# Patient Record
Sex: Male | Born: 1970 | Hispanic: Yes | Marital: Single | Smoking: Never smoker
Health system: Southern US, Community
[De-identification: ages and names within clinical notes are randomized; demographics above are authoritative.]

## PROBLEM LIST (undated history)

## (undated) DIAGNOSIS — E119 Type 2 diabetes mellitus without complications: Secondary | ICD-10-CM

---

## 2012-05-31 ENCOUNTER — Encounter (HOSPITAL_COMMUNITY): Payer: Self-pay | Admitting: Emergency Medicine

## 2012-05-31 ENCOUNTER — Emergency Department (HOSPITAL_COMMUNITY): Payer: Self-pay

## 2012-05-31 ENCOUNTER — Emergency Department (HOSPITAL_COMMUNITY)
Admission: EM | Admit: 2012-05-31 | Discharge: 2012-05-31 | Disposition: A | Discharge status: Home / Self Care | Payer: Self-pay | Attending: Emergency Medicine | Admitting: Emergency Medicine

## 2012-05-31 DIAGNOSIS — N390 Urinary tract infection, site not specified: Secondary | ICD-10-CM | POA: Insufficient documentation

## 2012-05-31 DIAGNOSIS — R7309 Other abnormal glucose: Secondary | ICD-10-CM | POA: Insufficient documentation

## 2012-05-31 DIAGNOSIS — K7689 Other specified diseases of liver: Secondary | ICD-10-CM | POA: Insufficient documentation

## 2012-05-31 DIAGNOSIS — E279 Disorder of adrenal gland, unspecified: Secondary | ICD-10-CM | POA: Insufficient documentation

## 2012-05-31 DIAGNOSIS — R339 Retention of urine, unspecified: Secondary | ICD-10-CM | POA: Insufficient documentation

## 2012-05-31 DIAGNOSIS — E278 Other specified disorders of adrenal gland: Secondary | ICD-10-CM

## 2012-05-31 DIAGNOSIS — R739 Hyperglycemia, unspecified: Secondary | ICD-10-CM

## 2012-05-31 LAB — URINALYSIS, ROUTINE W REFLEX MICROSCOPIC
Bilirubin Urine: NEGATIVE
Glucose, UA: >1000 mg/dL — AB
Specific Gravity, Urine: 1.02 (ref 1.005–1.030)
pH: 6 (ref 5.0–8.0)

## 2012-05-31 LAB — DIFFERENTIAL
Eosinophils Absolute: 0 10*3/uL (ref 0.0–0.7)
Eosinophils Relative: 0 % (ref 0–5)
Lymphs Abs: 0.9 10*3/uL (ref 0.7–4.0)
Monocytes Relative: 4 % (ref 3–12)
Neutrophils Relative %: 90 % — ABNORMAL HIGH (ref 43–77)

## 2012-05-31 LAB — BASIC METABOLIC PANEL
BUN: 10 mg/dL (ref 6–23)
CO2: 24 mEq/L (ref 19–32)
GFR calc non Af Amer: >90 mL/min (ref >90)
Glucose, Bld: 317 mg/dL — ABNORMAL HIGH (ref 70–99)
Potassium: 3.2 mEq/L — ABNORMAL LOW (ref 3.5–5.1)

## 2012-05-31 LAB — CBC
Hemoglobin: 13.3 g/dL (ref 13.0–17.0)
MCH: 27.3 pg (ref 26.0–34.0)
MCV: 78.1 fL (ref 78.0–100.0)
RBC: 4.88 MIL/uL (ref 4.22–5.81)

## 2012-05-31 LAB — URINE MICROSCOPIC-ADD ON

## 2012-05-31 MED ORDER — METFORMIN HCL 500 MG PO TABS: 500.0000 mg | ORAL_TABLET | Freq: Two times a day (BID) | ORAL | Status: DC

## 2012-05-31 MED ORDER — ONDANSETRON HCL 4 MG/2ML IJ SOLN
4.0000 mg | Freq: Once | INTRAMUSCULAR | Status: DC
  Filled 2012-05-31: qty 2

## 2012-05-31 MED ORDER — HYDROMORPHONE HCL PF 1 MG/ML IJ SOLN
1.0000 mg | Freq: Once | INTRAMUSCULAR | Status: DC
  Filled 2012-05-31: qty 1

## 2012-05-31 MED ORDER — KETOROLAC TROMETHAMINE 30 MG/ML IJ SOLN
30.0000 mg | Freq: Once | INTRAMUSCULAR | Status: DC
  Filled 2012-05-31: qty 1

## 2012-05-31 MED ORDER — CEPHALEXIN 500 MG PO CAPS: 500.0000 mg | ORAL_CAPSULE | Freq: Four times a day (QID) | ORAL | Status: AC

## 2012-05-31 MED ORDER — CEFTRIAXONE SODIUM 1 G IJ SOLR
1.0000 g | Freq: Once | INTRAMUSCULAR | Status: AC
  Administered 2012-05-31: 1 g via INTRAVENOUS
  Filled 2012-05-31: qty 10

## 2012-05-31 MED ORDER — SODIUM CHLORIDE 0.9 % IV SOLN: Freq: Once | INTRAVENOUS | Status: DC

## 2012-05-31 NOTE — ED Notes (Signed)
Patient denies pain and is resting comfortably.  States he does not want any pain medication at this time.  Initial output 300 cc with additional 1100 over 10 minutes.  Patient states he feels much better.

## 2012-05-31 NOTE — ED Notes (Addendum)
Patient complaining of groin pain and unable to void. States he last voided last night.

## 2012-05-31 NOTE — ED Provider Notes (Signed)
History     CSN: [id number]  Arrival date & time 05/31/12  0865   First MD Initiated Contact with Patient 05/31/12 [phone]      Chief Complaint  Patient presents with  . Urinary Retention    (Consider location/radiation/quality/duration/timing/severity/associated sxs/prior treatment) The history is provided by the patient.   41 year old male who has not been able to urinate since 9 PM. He is complaining of pain in the suprapubic area. Pain is severe and he rates it at 9/10. There is no associated nausea, vomiting, fever, chills, sweats. He's taking ibuprofen but no other medications.  History reviewed. No pertinent past medical history.  History reviewed. No pertinent past surgical history.  No family history on file.  History  Substance Use Topics  . Smoking status: Never Smoker   . Smokeless tobacco: Not on file  . Alcohol Use: No      Review of Systems  All other systems reviewed and are negative.    Allergies  Review of patient's allergies indicates no known allergies.  Home Medications   Current Outpatient Rx  Name Route Sig Dispense Refill  . IBUPROFEN 100 MG PO TABS Oral Take 100 mg by mouth every 6 (six) hours as needed.      BP 149/92  Pulse 87  Temp 99.2 F (37.3 C) (Oral)  Resp 20  Ht 5\' 10"  (1.778 m)  Wt 160 lb (72.576 kg)  BMI 22.96 kg/m2  SpO2 97%  Physical Exam  Nursing note and vitals reviewed.  41 year old male who appears uncomfortable. Vital signs are significant for hypertension with blood pressure 149/92. Oxygen saturation is 97% which is normal. Head is normocephalic and atraumatic. PERRLA, EOMI. Neck is nontender and supple. Back is nontender. Lungs are clear without rales, wheezes, or rhonchi. Heart has regular rate rhythm without murmur. Abdomen is soft, flat, with distended bladder which is tender. There is no CVA tenderness. Extremities have no cyanosis or edema, full range of motion is present. Skin is warm and dry without rash.  Neurologic: Mental status is normal, cranial nerves are intact, there are no motor or sensory deficits.  ED Course  Procedures (including critical care time)  Results for orders placed during the hospital encounter of 05/31/12  URINALYSIS, ROUTINE W REFLEX MICROSCOPIC      Component Value Range   Color, Urine YELLOW  YELLOW   APPearance CLEAR  CLEAR   Specific Gravity, Urine 1.020  1.005 - 1.030   pH 6.0  5.0 - 8.0   Glucose, UA >1000 (*) NEGATIVE mg/dL   Hgb urine dipstick MODERATE (*) NEGATIVE   Bilirubin Urine NEGATIVE  NEGATIVE   Ketones, ur NEGATIVE  NEGATIVE mg/dL   Protein, ur 30 (*) NEGATIVE mg/dL   Urobilinogen, UA 0.2  0.0 - 1.0 mg/dL   Nitrite NEGATIVE  NEGATIVE   Leukocytes, UA NEGATIVE  NEGATIVE  CBC      Component Value Range   WBC 15.1 (*) 4.0 - 10.5 K/uL   RBC 4.88  4.22 - 5.81 MIL/uL   Hemoglobin 13.3  13.0 - 17.0 g/dL   HCT 96.2 (*) 95.2 - 84.1 %   MCV 78.1  78.0 - 100.0 fL   MCH 27.3  26.0 - 34.0 pg   MCHC 34.9  30.0 - 36.0 g/dL   RDW 32.4  40.1 - 02.7 %   Platelets 356  150 - 400 K/uL  DIFFERENTIAL      Component Value Range   Neutrophils Relative 90 (*) 43 - 77 %  Neutro Abs 13.5 (*) 1.7 - 7.7 K/uL   Lymphocytes Relative 6 (*) 12 - 46 %   Lymphs Abs 0.9  0.7 - 4.0 K/uL   Monocytes Relative 4  3 - 12 %   Monocytes Absolute 0.5  0.1 - 1.0 K/uL   Eosinophils Relative 0  0 - 5 %   Eosinophils Absolute 0.0  0.0 - 0.7 K/uL   Basophils Relative 0  0 - 1 %   Basophils Absolute 0.0  0.0 - 0.1 K/uL  BASIC METABOLIC PANEL      Component Value Range   Sodium 133 (*) 135 - 145 mEq/L   Potassium 3.2 (*) 3.5 - 5.1 mEq/L   Chloride 96  96 - 112 mEq/L   CO2 24  19 - 32 mEq/L   Glucose, Bld 317 (*) 70 - 99 mg/dL   BUN 10  6 - 23 mg/dL   Creatinine, Ser 0.10 (*) 0.50 - 1.35 mg/dL   Calcium 9.0  8.4 - 27.2 mg/dL   GFR calc non Af Amer >90  >90 mL/min   GFR calc Af Amer >90  >90 mL/min  URINE MICROSCOPIC-ADD ON      Component Value Range   WBC, UA 7-10  <3  WBC/hpf   RBC / HPF 7-10  <3 RBC/hpf   Bacteria, UA MANY (*) RARE   Ct Abdomen Pelvis Wo Contrast  05/31/2012  *RADIOLOGY REPORT*  Clinical Data: Urinary retention.  Testicular pain.  CT ABDOMEN AND PELVIS WITHOUT CONTRAST  Technique:  Multidetector CT imaging of the abdomen and pelvis was performed following the standard protocol without intravenous contrast.  Comparison: None.  Findings: The visualized lung bases are clear.  Heart size is normal.  No significant pleural or pericardial effusion is present.  Diffuse fatty infiltration is present in the liver.  No focal hepatic lesions are evident.  The spleen is within normal limits. The stomach, duodenum, pancreas are within normal limits.  Common bile duct and gallbladder are unremarkable.  A 21 x 15 mm lesion of the right adrenal gland demonstrates measures approximately 30 HU. The left adrenal gland is normal.  The kidneys and ureters are within normal limits bilaterally. The urinary bladder is collapsed with a Foley catheter in place.  The rectosigmoid colon is within normal limits.  The remainder of the colon is unremarkable.  The appendix is visualized and within normal limits.  No significant adenopathy or free fluid is present. Mild degenerative changes are noted at the SI joints bilaterally. The bone windows are otherwise unremarkable.  IMPRESSION:  1.  21 mm right adrenal lesion is not clearly characterized. The lesion otherwise has benign characteristics.  Consider follow-up CT or more definitively noncontrast MRI in 12 months. 2.  Diffuse fatty infiltration of the liver. 3.  The urinary bladder is collapsed about a Foley catheter. 4.  Mild degenerative changes at the SI joints.  Original Report Authenticated By: Jamesetta Orleans. MATTERN, M.D.      1. Hyperglycemia   2. Urinary retention   3. UTI (lower urinary tract infection)   4. Adrenal mass       MDM  Acute urinary retention. Foley catheter will be placed and urine sent for  urinalysis.  He did not get much relief with placing Foley catheter, and only 300 mL of urine was obtained. It is possible that his symptoms are from ureterolithiasis a CT scan will be obtained and he is given hydromorphone, ondansetron, and ketorolac for pain and nausea.  Dione Booze, MD 06/01/12 [phone]

## 2012-05-31 NOTE — ED Notes (Signed)
Leg bag placed on pt for home use with instructions to have foley removed in one week.

## 2012-05-31 NOTE — ED Provider Notes (Signed)
Pt assumed at signout He has over urine output soon after foley placement He is now improved Abdomen soft, denies back pain, denies abdominal pain, no focal weakness He denies h/o DM I used phone interpreter to inform him of diabetes and will start metformin (denies liver disease or etoh use) Also advised of uti, and will need catheter for one week for urinary retention Also advised of adrenal mass, advised this could be cancer and needs repeat imaging in one year BP 149/92  Pulse 87  Temp 99.2 F (37.3 C) (Oral)  Resp 20  Ht 5\' 10"  (1.778 m)  Wt 160 lb (72.576 kg)  BMI 22.96 kg/m2  SpO2 97% Pt feels well for d/c home and understands instructions Abdomen soft He is circumcised, no testicular tenderness/edema (chaperone present).  No focal motor deficits  Joya Gaskins, MD 05/31/12 [phone]

## 2012-05-31 NOTE — Discharge Instructions (Signed)
Hiperglucemia (Hyperglycemia) La hiperglucemia ocurre cuando la glucosa (azcar) en su sangre est demasiado elevada. Puede suceder por varias razones, pero a menudo ocurre en personas que no saben que tienen diabetes o no la controlan adecuadamente.  CAUSAS Tanto si tiene diabetes como si no, existen otras causas para la hiperglucemia. La hiperglucemia puede producirse cuando tiene diabetes, pero tambin puede presentarse en otras situaciones de las que podra no ser consciente, como por ejemplo: Diabetes  Si tiene diabetes y tiene problemas para controlar su glucosa en sangre, la hiperglucemia podra producirse debido a las siguientes razones:   No seguir Press photographer.   No tomar los medicamentos para la diabetes o tomarlos de forma inadecuada.   Realizar menos ejercicio del que normalmente hace.   Estar enfermo.  Prediabetes  Esto no puede ignorarse. Antes de que la persona presente diabetes de tipo 2, casi siempre hay "prediabetes". Esto ocurre cuando su glucosa en sangre es mayor que lo normal, pero no lo suficiente como para diagnosticar diabetes. La investigacin ha demostrado que algunos daos al cuerpo de Air cabin crew, en especial los del corazn y el sistema circulatorio, podran haber ocurrido durante el periodo de prediabetes. Si controla la glucosa en sangre cuando tiene prediabetes, podr retardar o evitar que se desarrrolle la diabetes tipo 2.  El estrs  Si tiene diabetes, deber hacer una dieta, tomar medicamentos orales o insulina para [place]. Sin embargo, Clinical research associate que la glucosa en sangre es mayor que lo normal en el hospital tenga o no diabetes. Cientficamente se lo denomina "hiperglucemia por estrs". El estrs puede elevar su glucosa en sangre. Esto ocurre porque el organismo genera hormonas en los momentos de estrs. Si el estrs ha [employer] causa del alto nivel de glucosa en [place], [place] mdico podr Education officer, environmental un seguimiento de [place] regular. Feliberto Harts, podr asegurarse de que la hiperglucemia no empeora o progresa hacia diabetes.  Esteroides  Los esteroides son medicamentos que actan en la infeccin que ataca al sistema inmunolgico para bloquear la inflamacin o la infeccin. Un efecto secundario puede ser el aumento de glucosa en [place]. Muchas personas pueden producir la suficiente insulina extra para este aumento, pero aquellos que no pueden, los esteroides pueden [employer] niveles sean an [place]. No es inusual que los tratamientos con esteorides "destapen" una diabetes que se est desarrollando. No siempre es posible determinar si la hiperglucemia desaparecer una vez que se detenga el consumo de esteroides. A veces se realiza un anlisis de sangre especial denominado A1c para determinar si la glucosa en sangre se ha elevado antes de comenzar con el consumo de esteroides.  SNTOMAS  Sed.   Necesidad frecuente de Geographical information systems officer.   [employer].   Visin borrosa.   Cansancio o fatiga.   Debilidad.   Somnolencia.   Hormigueo en el pie o pierna.  DIAGNSTICO El diagnstico se realiza mediante el control de la glucosa en sangre de una o varias de las siguientes maneras:  Anlisis A1c. Es una sustancia qumica que se encuentra en la [place].   Control de glucosa en sangre con tiras de prueba.   Resultados de laboratorio.  TRATAMIENTO Primero, es importante conocer la causa de la hiperglucemia antes de tratarla. El tratamiento puede ser el siguiente, [place] pueden ser otros:  Educacin   Cambios o ajustes en los medicamentos.   Cambios o ajustes en el plan de alimentacin.   Tratamiento por enfermedades, infecciones, etc.   Control de glucosa en sangre ms frecuente.  Cambios en el plan de ejercicios.   Disminucin o interrupcin del consumo de esteroides.   Cambios en el estilo de vida.  INSTRUCCIONES PARA EL CUIDADO DOMICILIARIO  Contrlese la glucosa en sangre, como se lo indicaron.   Haga ejercicios  regularmente. El profesional que lo asiste le dar instrucciones relacionadas con el ejercicio fsico. La prediabetes que es consecuencia de situaciones de estrs, puede mejorar con la actividad fsica.   Consuma alimentos saludables y balanceados. Coma a menudo y de [place] regular, en momentos fijos. El profesional o el nutricionista le dar una dieta especial para controlar su ingestin de azcar.   Mantener su peso ideal es importante. Si lo necesita, perder un poco de peso, como 5  7 Kg. puede ser beneficioso para [employer] niveles de Production assistant, radio.  SOLICITE ATENCIN MDICA SI:  Tiene preguntas relacionadas con los medicamentos, la actividad o la dieta.   Contina teniendo sntomas (como mucha sed, deseos intensos de Geographical information systems officer o aumento de peso)  SOLICITE ATENCIN MDICA DE INMEDIATO SI:  Vomita o tiene diarrea.   Su respiracin huele frutal.   La frecuencia respiratoria es ms rpida o ms lenta.   Est somnoliento o incoherente.   Siente adormecimiento, hormigueos o Tax adviser o en las manos.   Siente dolor en el pecho.   Sus sntomas empeoran aunque haya seguido las indicaciones de su mdico.   Tiene otras preguntas o preocupaciones.  Document Released: 11/29/2005 Document Revised: 11/18/2011 [facility] Patient Information 2012 [place], [place].Hiperglucemia (Hyperglycemia) La hiperglucemia ocurre cuando la glucosa (azcar) en su sangre est demasiado elevada. Puede suceder por varias razones, pero a menudo ocurre en personas que no saben que tienen diabetes o no la controlan adecuadamente.  CAUSAS Tanto si tiene diabetes como si no, existen otras causas para la hiperglucemia. La hiperglucemia puede producirse cuando tiene diabetes, pero tambin puede presentarse en otras situaciones de las que podra no ser consciente, como por ejemplo: Diabetes  Si tiene diabetes y tiene problemas para controlar su glucosa en sangre, la hiperglucemia podra producirse debido a las  siguientes razones:   No seguir Press photographer.   No tomar los medicamentos para la diabetes o tomarlos de forma inadecuada.   Realizar menos ejercicio del que normalmente hace.   Estar enfermo.  Prediabetes  Esto no puede ignorarse. Antes de que la persona presente diabetes de tipo 2, casi siempre hay "prediabetes". Esto ocurre cuando su glucosa en sangre es mayor que lo normal, pero no lo suficiente como para diagnosticar diabetes. La investigacin ha demostrado que algunos daos al cuerpo de Air cabin crew, en especial los del corazn y el sistema circulatorio, podran haber ocurrido durante el periodo de prediabetes. Si controla la glucosa en sangre cuando tiene prediabetes, podr retardar o evitar que se desarrrolle la diabetes tipo 2.  El estrs  Si tiene diabetes, deber hacer una dieta, tomar medicamentos orales o insulina para [place]. Sin embargo, Clinical research associate que la glucosa en sangre es mayor que lo normal en el hospital tenga o no diabetes. Cientficamente se lo denomina "hiperglucemia por estrs". El estrs puede elevar su glucosa en sangre. Esto ocurre porque el organismo genera hormonas en los momentos de estrs. Si el estrs ha [employer] causa del alto nivel de glucosa en [place], [place] mdico podr Education officer, environmental un seguimiento de [place] regular. Feliberto Harts, podr asegurarse de que la hiperglucemia no empeora o progresa hacia diabetes.  Esteroides  Los esteroides son medicamentos que actan en la infeccin que  ataca al sistema inmunolgico para bloquear la inflamacin o la infeccin. Un efecto secundario puede ser el aumento de glucosa en [place]. Muchas personas pueden producir la suficiente insulina extra para este aumento, pero aquellos que no pueden, los esteroides pueden [employer] niveles sean an [place]. No es inusual que los tratamientos con esteorides "destapen" una diabetes que se est desarrollando. No siempre es posible determinar si la hiperglucemia desaparecer una  vez que se detenga el consumo de esteroides. A veces se realiza un anlisis de sangre especial denominado A1c para determinar si la glucosa en sangre se ha elevado antes de comenzar con el consumo de esteroides.  SNTOMAS  Sed.   Necesidad frecuente de Geographical information systems officer.   [employer].   Visin borrosa.   Cansancio o fatiga.   Debilidad.   Somnolencia.   Hormigueo en el pie o pierna.  DIAGNSTICO El diagnstico se realiza mediante el control de la glucosa en sangre de una o varias de las siguientes maneras:  Anlisis A1c. Es una sustancia qumica que se encuentra en la [place].   Control de glucosa en sangre con tiras de prueba.   Resultados de laboratorio.  TRATAMIENTO Primero, es importante conocer la causa de la hiperglucemia antes de tratarla. El tratamiento puede ser el siguiente, [place] pueden ser otros:  Educacin   Cambios o ajustes en los medicamentos.   Cambios o ajustes en el plan de alimentacin.   Tratamiento por enfermedades, infecciones, etc.   Control de glucosa en sangre ms frecuente.   Cambios en el plan de ejercicios.   Disminucin o interrupcin del consumo de esteroides.   Cambios en el estilo de vida.  INSTRUCCIONES PARA EL CUIDADO DOMICILIARIO  Contrlese la glucosa en sangre, como se lo indicaron.   Haga ejercicios regularmente. El profesional que lo asiste le dar instrucciones relacionadas con el ejercicio fsico. La prediabetes que es consecuencia de situaciones de estrs, puede mejorar con la actividad fsica.   Consuma alimentos saludables y balanceados. Coma a menudo y de [place] regular, en momentos fijos. El profesional o el nutricionista le dar una dieta especial para controlar su ingestin de azcar.   Mantener su peso ideal es importante. Si lo necesita, perder un poco de peso, como 5  7 Kg. puede ser beneficioso para [employer] niveles de Production assistant, radio.  SOLICITE ATENCIN MDICA SI:  Tiene preguntas relacionadas con los medicamentos,  la actividad o la dieta.   Contina teniendo sntomas (como mucha sed, deseos intensos de Geographical information systems officer o aumento de peso)  SOLICITE ATENCIN MDICA DE INMEDIATO SI:  Vomita o tiene diarrea.   Su respiracin huele frutal.   La frecuencia respiratoria es ms rpida o ms lenta.   Est somnoliento o incoherente.   Siente adormecimiento, hormigueos o Tax adviser o en las manos.   Siente dolor en el pecho.   Sus sntomas empeoran aunque haya seguido las indicaciones de su mdico.   Tiene otras preguntas o preocupaciones.  Document Released: 11/29/2005 Document Revised: 11/18/2011 [facility] Patient Information 2012 [place], [place].

## 2012-06-02 LAB — URINE CULTURE: Culture  Setup Time: 201306191130

## 2012-06-03 NOTE — ED Notes (Signed)
+  Urine. Patient given Keflex. No sensitivity listed. Chart sent to EDP office for review. °

## 2012-06-04 NOTE — ED Notes (Signed)
Chart returned from EDP office. "Keflex should cover gram+ UTI. No change. Reviewed by Rhea Bleacher PA-C.

## 2016-10-27 ENCOUNTER — Ambulatory Visit (INDEPENDENT_AMBULATORY_CARE_PROVIDER_SITE_OTHER): Payer: Managed Care, Other (non HMO)

## 2016-10-27 ENCOUNTER — Ambulatory Visit (INDEPENDENT_AMBULATORY_CARE_PROVIDER_SITE_OTHER): Payer: Managed Care, Other (non HMO) | Admitting: Orthopedic Surgery

## 2016-10-27 ENCOUNTER — Encounter (INDEPENDENT_AMBULATORY_CARE_PROVIDER_SITE_OTHER): Payer: Self-pay | Admitting: Orthopedic Surgery

## 2016-10-27 VITALS — Ht 70.0 in | Wt 218.0 lb

## 2016-10-27 DIAGNOSIS — M79672 Pain in left foot: Secondary | ICD-10-CM

## 2016-10-27 DIAGNOSIS — E11610 Type 2 diabetes mellitus with diabetic neuropathic arthropathy: Secondary | ICD-10-CM | POA: Diagnosis not present

## 2016-10-27 NOTE — Progress Notes (Signed)
   Office Visit Note   Patient: Kurt CongGabriel Silva-Galvan           Date of Birth: [birth year]           MRN: [id number] Visit Date: 10/27/2016              Requested by: No referring provider defined for this encounter. PCP: Pcp Not In System   Assessment & Plan: Visit Diagnoses:  1. Charcot foot due to diabetes mellitus (HCC)   2. Pain in left foot     Plan: Will have patient get custom orthotics and by intact. Patient states he is currently going to a primary care physician in [place] and is trying to improve his glucose control. Discussed with the improvement glucose control and custom orthotics do not work we would need to consider internal fixation which would be effusion along the medial column plantar excision of the cuneiform extrusion plantarly.  Follow-Up Instructions: Return in about 3 months (around 01/27/2017).   Orders:  Orders Placed This Encounter  Procedures  . XR Foot Complete Left   No orders of the defined types were placed in this encounter.     Procedures: No procedures performed   Clinical Data: No additional findings.   Subjective: Chief Complaint  Patient presents with  . Left Foot - Pain    Referral Charcot foot.     Patient has been referred for evaluation for possible Charcot deformity of the left foot. Pts last A1C was 10.3 in July. Patient states pain and swelling for the past 7 months. He is in regular shoe wear. Does complain of some tenderness on the bottom of his foot but the skin is intact and no ulcerations noted. Skin is dry and peeling. Patient has an x ray from 11/2015 for review.    Review of Systems   Objective: Vital Signs: Ht 5\' 10"  (1.778 m)   Wt 218 lb (98.9 kg)   BMI 31.28 kg/m   Physical Exam Patient is alert oriented no adenopathy well-dressed normal affect normal respiratory effort.  Patient has a normal gait. Patient has good hair growth to the ankle. He has a strong dorsalis pedis pulse there is no redness no  cellulitis no signs of active Charcot process. Patient does have callus formation at the rocker-bottom deformity of the cuneiform extrusion laterally. Medially there is extrusion of the medial cuneiform but there is no ulceration at this location. Patient does not have protective sensation.  Ortho Exam  Specialty Comments:  No specialty comments available.  Imaging: Xr Foot Complete Left  Result Date: 10/27/2016 Three-view radiographs of the left foot shows Charcot arthropathy through the midfoot. Patient has collapse of the talonavicular joint with complete extrusion of the medial cuneiform and inferior displacement of the middle and lateral cuneiforms. Patient has no signs of active process at this time.    PMFS History: There are no active problems to display for this patient.  No past medical history on file.  No family history on file.  No past surgical history on file. Social History   Occupational History  . Not on file.   Social History Main Topics  . Smoking status: Never Smoker  . Smokeless tobacco: Not on file  . Alcohol use No  . Drug use: No  . Sexual activity: Not on file

## 2016-12-13 HISTORY — PX: AMPUTATION TOE: SHX6595

## 2017-01-27 ENCOUNTER — Ambulatory Visit (INDEPENDENT_AMBULATORY_CARE_PROVIDER_SITE_OTHER): Payer: Managed Care, Other (non HMO) | Admitting: Orthopedic Surgery

## 2018-07-17 ENCOUNTER — Emergency Department (HOSPITAL_COMMUNITY): Payer: Self-pay

## 2018-07-17 ENCOUNTER — Inpatient Hospital Stay (HOSPITAL_COMMUNITY)
Admission: EM | Admit: 2018-07-17 | Discharge: 2018-07-23 | DRG: 617 | Disposition: A | Discharge status: Home / Self Care | Payer: Managed Care, Other (non HMO) | Attending: Internal Medicine | Admitting: Internal Medicine

## 2018-07-17 ENCOUNTER — Encounter (HOSPITAL_COMMUNITY): Payer: Self-pay | Admitting: Emergency Medicine

## 2018-07-17 ENCOUNTER — Other Ambulatory Visit: Payer: Self-pay

## 2018-07-17 DIAGNOSIS — E861 Hypovolemia: Secondary | ICD-10-CM | POA: Diagnosis present

## 2018-07-17 DIAGNOSIS — E1169 Type 2 diabetes mellitus with other specified complication: Principal | ICD-10-CM | POA: Diagnosis present

## 2018-07-17 DIAGNOSIS — E1140 Type 2 diabetes mellitus with diabetic neuropathy, unspecified: Secondary | ICD-10-CM | POA: Diagnosis present

## 2018-07-17 DIAGNOSIS — E871 Hypo-osmolality and hyponatremia: Secondary | ICD-10-CM | POA: Diagnosis present

## 2018-07-17 DIAGNOSIS — L97409 Non-pressure chronic ulcer of unspecified heel and midfoot with unspecified severity: Secondary | ICD-10-CM | POA: Diagnosis present

## 2018-07-17 DIAGNOSIS — R945 Abnormal results of liver function studies: Secondary | ICD-10-CM | POA: Diagnosis present

## 2018-07-17 DIAGNOSIS — E119 Type 2 diabetes mellitus without complications: Secondary | ICD-10-CM

## 2018-07-17 DIAGNOSIS — A419 Sepsis, unspecified organism: Secondary | ICD-10-CM

## 2018-07-17 DIAGNOSIS — D649 Anemia, unspecified: Secondary | ICD-10-CM | POA: Diagnosis present

## 2018-07-17 DIAGNOSIS — E11621 Type 2 diabetes mellitus with foot ulcer: Secondary | ICD-10-CM | POA: Diagnosis present

## 2018-07-17 DIAGNOSIS — R7989 Other specified abnormal findings of blood chemistry: Secondary | ICD-10-CM

## 2018-07-17 DIAGNOSIS — R809 Proteinuria, unspecified: Secondary | ICD-10-CM | POA: Diagnosis present

## 2018-07-17 DIAGNOSIS — Z89511 Acquired absence of right leg below knee: Secondary | ICD-10-CM

## 2018-07-17 DIAGNOSIS — L03115 Cellulitis of right lower limb: Secondary | ICD-10-CM

## 2018-07-17 DIAGNOSIS — D509 Iron deficiency anemia, unspecified: Secondary | ICD-10-CM | POA: Diagnosis present

## 2018-07-17 DIAGNOSIS — E8809 Other disorders of plasma-protein metabolism, not elsewhere classified: Secondary | ICD-10-CM | POA: Diagnosis present

## 2018-07-17 DIAGNOSIS — E11610 Type 2 diabetes mellitus with diabetic neuropathic arthropathy: Secondary | ICD-10-CM | POA: Diagnosis present

## 2018-07-17 DIAGNOSIS — Z6834 Body mass index (BMI) 34.0-34.9, adult: Secondary | ICD-10-CM

## 2018-07-17 DIAGNOSIS — Z89411 Acquired absence of right great toe: Secondary | ICD-10-CM

## 2018-07-17 DIAGNOSIS — K5900 Constipation, unspecified: Secondary | ICD-10-CM | POA: Diagnosis present

## 2018-07-17 DIAGNOSIS — E669 Obesity, unspecified: Secondary | ICD-10-CM | POA: Diagnosis present

## 2018-07-17 DIAGNOSIS — M86171 Other acute osteomyelitis, right ankle and foot: Secondary | ICD-10-CM | POA: Diagnosis present

## 2018-07-17 DIAGNOSIS — L0291 Cutaneous abscess, unspecified: Secondary | ICD-10-CM

## 2018-07-17 DIAGNOSIS — E440 Moderate protein-calorie malnutrition: Secondary | ICD-10-CM | POA: Diagnosis present

## 2018-07-17 HISTORY — DX: Type 2 diabetes mellitus without complications: E11.9

## 2018-07-17 LAB — CBC WITH DIFFERENTIAL/PLATELET
BASOS ABS: 0 10*3/uL (ref 0.0–0.1)
Basophils Relative: 0 %
EOS PCT: 2 %
Eosinophils Absolute: 0.3 10*3/uL (ref 0.0–0.7)
HCT: 37.1 % — ABNORMAL LOW (ref 39.0–52.0)
Hemoglobin: 12.7 g/dL — ABNORMAL LOW (ref 13.0–17.0)
LYMPHS ABS: 1.8 10*3/uL (ref 0.7–4.0)
LYMPHS PCT: 11 %
MCH: 28.7 pg (ref 26.0–34.0)
MCHC: 34.2 g/dL (ref 30.0–36.0)
MCV: 83.9 fL (ref 78.0–100.0)
MONO ABS: 1.4 10*3/uL — AB (ref 0.1–1.0)
MONOS PCT: 8 %
Neutro Abs: 12.9 10*3/uL — ABNORMAL HIGH (ref 1.7–7.7)
Neutrophils Relative %: 79 %
PLATELETS: 434 10*3/uL — AB (ref 150–400)
RBC: 4.42 MIL/uL (ref 4.22–5.81)
RDW: 13.4 % (ref 11.5–15.5)
WBC: 16.4 10*3/uL — ABNORMAL HIGH (ref 4.0–10.5)

## 2018-07-17 LAB — COMPREHENSIVE METABOLIC PANEL
ALBUMIN: 2.8 g/dL — AB (ref 3.5–5.0)
ALT: 49 U/L — ABNORMAL HIGH (ref 0–44)
AST: 53 U/L — AB (ref 15–41)
Alkaline Phosphatase: 151 U/L — ABNORMAL HIGH (ref 38–126)
Anion gap: 10 (ref 5–15)
BILIRUBIN TOTAL: 1.5 mg/dL — AB (ref 0.3–1.2)
BUN: 22 mg/dL — AB (ref 6–20)
CO2: 26 mmol/L (ref 22–32)
Calcium: 8.4 mg/dL — ABNORMAL LOW (ref 8.9–10.3)
Chloride: 94 mmol/L — ABNORMAL LOW (ref 98–111)
Creatinine, Ser: 1.23 mg/dL (ref 0.61–1.24)
GFR calc Af Amer: >60 mL/min (ref >60)
GFR calc non Af Amer: >60 mL/min (ref >60)
GLUCOSE: 162 mg/dL — AB (ref 70–99)
POTASSIUM: 4.3 mmol/L (ref 3.5–5.1)
Sodium: 130 mmol/L — ABNORMAL LOW (ref 135–145)
TOTAL PROTEIN: 8.1 g/dL (ref 6.5–8.1)

## 2018-07-17 LAB — I-STAT CG4 LACTIC ACID, ED
LACTIC ACID, VENOUS: 1.14 mmol/L (ref 0.5–1.9)
Lactic Acid, Venous: 1.45 mmol/L (ref 0.5–1.9)

## 2018-07-17 MED ORDER — PIPERACILLIN-TAZOBACTAM 3.375 G IVPB 30 MIN
3.3750 g | Freq: Once | INTRAVENOUS | Status: AC
Start: 2018-07-17 — End: 2018-07-17
  Administered 2018-07-17: 3.375 g via INTRAVENOUS
  Filled 2018-07-17: qty 50

## 2018-07-17 MED ORDER — SODIUM CHLORIDE 0.9 % IV BOLUS
2000.0000 mL | Freq: Once | INTRAVENOUS | Status: AC
  Administered 2018-07-17: 2000 mL via INTRAVENOUS

## 2018-07-17 MED ORDER — CLINDAMYCIN PHOSPHATE 600 MG/50ML IV SOLN
600.0000 mg | Freq: Once | INTRAVENOUS | Status: AC
  Administered 2018-07-17: 600 mg via INTRAVENOUS
  Filled 2018-07-17: qty 50

## 2018-07-17 MED ORDER — ACETAMINOPHEN 500 MG PO TABS
1000.0000 mg | ORAL_TABLET | Freq: Once | ORAL | Status: AC
  Administered 2018-07-17: 1000 mg via ORAL
  Filled 2018-07-17: qty 2

## 2018-07-17 MED ORDER — HYDROMORPHONE HCL 1 MG/ML IJ SOLN
1.0000 mg | Freq: Once | INTRAMUSCULAR | Status: AC
  Administered 2018-07-17: 1 mg via INTRAVENOUS
  Filled 2018-07-17: qty 1

## 2018-07-17 MED ORDER — VANCOMYCIN HCL IN DEXTROSE 1-5 GM/200ML-% IV SOLN
1000.0000 mg | Freq: Once | INTRAVENOUS | Status: AC
  Administered 2018-07-17: 1000 mg via INTRAVENOUS
  Filled 2018-07-17: qty 200

## 2018-07-17 MED ORDER — IOPAMIDOL (ISOVUE-300) INJECTION 61%
100.0000 mL | Freq: Once | INTRAVENOUS | Status: AC | PRN
  Administered 2018-07-17: 100 mL via INTRAVENOUS

## 2018-07-17 NOTE — ED Triage Notes (Signed)
Pt has wound to the bottom of right foot x 2 days. The foot is swollen and draining.

## 2018-07-17 NOTE — ED Provider Notes (Signed)
Emergency Department Provider Note   I have reviewed the triage vital signs and the nursing notes.   HISTORY  Chief Complaint Wound Infection   HPI Kurt Anderson is a 47 y.o. male with a history of diabetes and osteomyelitis requiring a ray amputation on his right foot in the past who presents the emergency department today with right foot pain.  Patient states that is always swollen based on sure how long it has been however the last few days it has been draining malodorous purulent material comes here for further evaluation.  Denies any fever at home just worsening pain.  He does not think it is any more swollen than what ever his baseline is.  No symptoms elsewhere. No other associated or modifying symptoms.    History reviewed. No pertinent past medical history.  There are no active problems to display for this patient.   History reviewed. No pertinent surgical history.  Current Outpatient Rx  . Order #: [id number] Class: Historical Med  . Order #: [id number] Class: Historical Med  . Order #: [id number] Class: Normal    Allergies Patient has no known allergies.  No family history on file.  Social History Social History   Tobacco Use  . Smoking status: Never Smoker  . Smokeless tobacco: Never Used  Substance Use Topics  . Alcohol use: No  . Drug use: No    Review of Systems  All other systems negative except as documented in the HPI. All pertinent positives and negatives as reviewed in the HPI. ____________________________________________   PHYSICAL EXAM:  VITAL SIGNS: ED Triage Vitals  Enc Vitals Group     BP 07/17/18 2037 99/67     Pulse Rate 07/17/18 2037 (!) 108     Resp 07/17/18 2037 (!) 22     Temp 07/17/18 2037 (!) 101.3 F (38.5 C)     Temp src --      SpO2 07/17/18 2037 99 %     Weight 07/17/18 2038 218 lb (98.9 kg)    Constitutional: Alert and oriented. Well appearing and in no acute distress. Eyes: Conjunctivae are normal. PERRL.  EOMI. Head: Atraumatic. Nose: No congestion/rhinnorhea. Mouth/Throat: Mucous membranes are moist.  Oropharynx non-erythematous. Neck: No stridor.  No meningeal signs.   Cardiovascular: Normal rate, regular rhythm. Good peripheral circulation. Grossly normal heart sounds.   Respiratory: Normal respiratory effort.  No retractions. Lungs CTAB. Gastrointestinal: Soft and nontender. No distention.  Musculoskeletal: R foot with significant swelling, erythema, and drainage from two areas (see pic below for better description of open wound on sole of foot and right side). No gross deformities of extremities. Neurologic:  Normal speech and language. No gross focal neurologic deficits are appreciated.  Skin:  see pic below for better description of open wound on sole of foot and right side.      Images contained in this chart were taken by me using Haiku application on my iPhone after verbal consent from the patient. No images were stored in any way on my personal device. ____________________________________________   LABS (all labs ordered are listed, but only abnormal results are displayed)  Labs Reviewed  COMPREHENSIVE METABOLIC PANEL - Abnormal; Notable for the following components:      Result Value   Sodium 130 (*)    Chloride 94 (*)    Glucose, Bld 162 (*)    BUN 22 (*)    Calcium 8.4 (*)    Albumin 2.8 (*)    AST 53 (*)    ALT  49 (*)    Alkaline Phosphatase 151 (*)    Total Bilirubin 1.5 (*)    All other components within normal limits  CBC WITH DIFFERENTIAL/PLATELET - Abnormal; Notable for the following components:   WBC 16.4 (*)    Hemoglobin 12.7 (*)    HCT 37.1 (*)    Platelets 434 (*)    Neutro Abs 12.9 (*)    Monocytes Absolute 1.4 (*)    All other components within normal limits  CULTURE, BLOOD (ROUTINE X 2)  CULTURE, BLOOD (ROUTINE X 2)  URINALYSIS, ROUTINE W REFLEX MICROSCOPIC  I-STAT CG4 LACTIC ACID, ED   ____________________________________________  EKG    EKG Interpretation  Date/Time:  Monday July 17 2018 21:08:02 EDT Ventricular Rate:  95 PR Interval:    QRS Duration: 120 QT Interval:  374 QTC Calculation: 471 R Axis:   40 Text Interpretation:  Sinus rhythm Probable left atrial enlargement Nonspecific intraventricular conduction delay Minimal ST elevation, anterior leads, likely BRP No old tracing to compare Confirmed by Marily Memos [phone]) on 07/17/2018 9:57:28 PM       ____________________________________________  RADIOLOGY  Dg Foot Complete Right  Result Date: 07/17/2018 CLINICAL DATA:  Infected blister along the plantar aspect of the right foot. History of diabetes. Code sepsis. EXAM: RIGHT FOOT COMPLETE - 3+ VIEW COMPARISON:  None. FINDINGS: Soft tissues: There is marked soft tissue swelling of the included ankle and more so of the midfoot where there are scattered areas of subcutaneous soft tissue emphysema noted along the dorsum, medial and lateral aspect of the mid and forefoot. Soft tissue ulceration is seen along the plantar surface of the midfoot measuring approximately 2 cm in length and 6 mm deep. Forefoot: There has been amputation of the first ray at the base of the first metatarsal. The second through fourth distal and middle phalanges are intact. Osteoarthritic and erosive change of the second metatarsal head at the second MTP joint is identified with bony spurring at the base of the second proximal phalanx. The third through fifth MTP joints are intact. Midfoot: Bony destruction and disorganization of the midfoot is noted more markedly affecting the middle and lateral cuneiform with subchondral erosive change at the base of the medial cuneiform, tarsal navicular and cuboid. Laterally subluxed appearance of the third through fifth rays with widening between the second and third as well as first and second metatarsals is noted. Findings likely represent stigmata of Charcot neuropathy the presence of soft tissue swelling and  emphysema raise concern for superimposed osteomyelitis of the midfoot more so involving the base of the second through fifth metatarsals with bone destruction seen as well as of the middle cuneiform. Hindfoot: Prominent plantar calcaneal enthesophyte. Slight cortical irregularity along the medial talar dome may be secondary to osteoarthritis though the possibility of osteochondritis dissecans or other transchondral injury of the talar dome is not entirely excluded. IMPRESSION: 1. Marked soft tissue swelling of the included ankle and more so of the midfoot with associated plantar soft tissue ulceration measuring 2 cm in length by 0.6 cm in depth at the level of the midfoot. 2. 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 However, superimposed on neuropathy is what appear be areas of irregular bone destruction specially involving the base of the second through fifth metatarsals raising concern for osteomyelitis. 3. Soft tissue emphysema is noted of the foot and the possibility of necrotizing  fasciitis is also raised. 4. Talar dome irregularity along its medial aspect is suggested on the oblique view. Osteochondral defect might have this appearance or change secondary to advanced osteoarthritis across the tibiotalar joint. Electronically Signed   By: Tollie Ethavid  Kwon M.D.   On: 07/17/2018 21:54    ____________________________________________   PROCEDURES  Procedure(s) performed:   Procedures  CRITICAL CARE Performed by: Marily MemosJason Larsen Zettel Total critical care time: 35 minutes Critical care time was exclusive of separately billable procedures and treating other patients. Critical care was necessary to treat or prevent imminent or life-threatening deterioration. Critical care was time spent personally by me on the following activities: development of treatment plan with  patient and/or surrogate as well as nursing, discussions with consultants, evaluation of patient's response to treatment, examination of patient, obtaining history from patient or surrogate, ordering and performing treatments and interventions, ordering and review of laboratory studies, ordering and review of radiographic studies, pulse oximetry and re-evaluation of patient's condition.  ____________________________________________   INITIAL IMPRESSION / ASSESSMENT AND PLAN / ED COURSE  Likely osteomyelitis.  Vancomycin already started.  Sepsis parameters seem to have improved with antibiotics and fluids.  Pain medication ordered.  Will admit to hospitalist.  xr with concern for possible nec fasc. Zosyn and clinda added on. After discussion with Dr. Robb Matarrtiz with [facility] hospitalist, will CT to evaluate best place to admit patient.   CT with e/o gas up to just below knee, [facility]   Pertinent labs & imaging results that were available during my care of the patient were reviewed by me and considered in my medical decision making (see chart for details).  ____________________________________________  FINAL CLINICAL IMPRESSION(S) / ED DIAGNOSES  Final diagnoses:  Sepsis, due to unspecified organism (HCC)  Cellulitis of right lower extremity  Abscess     MEDICATIONS GIVEN DURING THIS VISIT:  Medications  sodium chloride 0.9 % bolus 2,000 mL (2,000 mLs Intravenous New Bag/Given 07/17/18 2115)  vancomycin (VANCOCIN) IVPB 1000 mg/200 mL premix (1,000 mg Intravenous New Bag/Given 07/17/18 2116)  acetaminophen (TYLENOL) tablet 1,000 mg (has no administration in time range)  HYDROmorphone (DILAUDID) injection 1 mg (has no administration in time range)     NEW OUTPATIENT MEDICATIONS STARTED DURING THIS VISIT:  New Prescriptions   No medications on file    Note:  This note was prepared with assistance of Dragon voice recognition software. Occasional wrong-word or  sound-a-like substitutions may have occurred due to the inherent limitations of voice recognition software.   Balthazar Dooly, Barbara CowerJason, MD 07/18/18 [phone]

## 2018-07-18 ENCOUNTER — Encounter (HOSPITAL_COMMUNITY): Payer: Self-pay | Admitting: Internal Medicine

## 2018-07-18 ENCOUNTER — Inpatient Hospital Stay (HOSPITAL_COMMUNITY): Payer: Self-pay

## 2018-07-18 DIAGNOSIS — M86171 Other acute osteomyelitis, right ankle and foot: Secondary | ICD-10-CM | POA: Diagnosis not present

## 2018-07-18 DIAGNOSIS — E8809 Other disorders of plasma-protein metabolism, not elsewhere classified: Secondary | ICD-10-CM | POA: Diagnosis present

## 2018-07-18 DIAGNOSIS — R945 Abnormal results of liver function studies: Secondary | ICD-10-CM

## 2018-07-18 DIAGNOSIS — R7989 Other specified abnormal findings of blood chemistry: Secondary | ICD-10-CM

## 2018-07-18 DIAGNOSIS — E871 Hypo-osmolality and hyponatremia: Secondary | ICD-10-CM | POA: Diagnosis present

## 2018-07-18 DIAGNOSIS — R809 Proteinuria, unspecified: Secondary | ICD-10-CM | POA: Diagnosis present

## 2018-07-18 DIAGNOSIS — D649 Anemia, unspecified: Secondary | ICD-10-CM | POA: Diagnosis present

## 2018-07-18 DIAGNOSIS — L0390 Cellulitis, unspecified: Secondary | ICD-10-CM

## 2018-07-18 DIAGNOSIS — E119 Type 2 diabetes mellitus without complications: Secondary | ICD-10-CM

## 2018-07-18 LAB — RETICULOCYTES
RBC.: 3.63 MIL/uL — ABNORMAL LOW (ref 4.22–5.81)
RETIC COUNT ABSOLUTE: 36.3 10*3/uL (ref 19.0–186.0)
Retic Ct Pct: 1 % (ref 0.4–3.1)

## 2018-07-18 LAB — SODIUM, URINE, RANDOM: SODIUM UR: 16 mmol/L

## 2018-07-18 LAB — URINALYSIS, ROUTINE W REFLEX MICROSCOPIC
BILIRUBIN URINE: NEGATIVE
Bacteria, UA: NONE SEEN
GLUCOSE, UA: NEGATIVE mg/dL
HGB URINE DIPSTICK: NEGATIVE
KETONES UR: NEGATIVE mg/dL
LEUKOCYTES UA: NEGATIVE
NITRITE: NEGATIVE
PROTEIN: 100 mg/dL — AB
Specific Gravity, Urine: 1.029 (ref 1.005–1.030)
pH: 5 (ref 5.0–8.0)

## 2018-07-18 LAB — FOLATE: FOLATE: 8.4 ng/mL (ref >5.9)

## 2018-07-18 LAB — FERRITIN: FERRITIN: 1349 ng/mL — AB (ref 24–336)

## 2018-07-18 LAB — GLUCOSE, CAPILLARY
GLUCOSE-CAPILLARY: 94 mg/dL (ref 70–99)
GLUCOSE-CAPILLARY: 144 mg/dL — AB (ref 70–99)
Glucose-Capillary: 66 mg/dL — ABNORMAL LOW (ref 70–99)
Glucose-Capillary: 117 mg/dL — ABNORMAL HIGH (ref 70–99)

## 2018-07-18 LAB — HEMOGLOBIN A1C
Hgb A1c MFr Bld: 7.4 % — ABNORMAL HIGH (ref 4.8–5.6)
Mean Plasma Glucose: 165.68 mg/dL

## 2018-07-18 LAB — CBG MONITORING, ED: GLUCOSE-CAPILLARY: 71 mg/dL (ref 70–99)

## 2018-07-18 LAB — IRON AND TIBC
IRON: 17 ug/dL — AB (ref 45–182)
Saturation Ratios: 13 % — ABNORMAL LOW (ref 17.9–39.5)
TIBC: 130 ug/dL — ABNORMAL LOW (ref 250–450)
UIBC: 113 ug/dL

## 2018-07-18 LAB — VITAMIN B12: Vitamin B-12: 720 pg/mL (ref 180–914)

## 2018-07-18 LAB — C-REACTIVE PROTEIN: CRP: 28 mg/dL — ABNORMAL HIGH (ref <1.0)

## 2018-07-18 MED ORDER — SODIUM CHLORIDE 0.9 % IV SOLN
INTRAVENOUS | Status: DC
  Administered 2018-07-18 – 2018-07-19 (×2): via INTRAVENOUS

## 2018-07-18 MED ORDER — SODIUM CHLORIDE 0.9 % IV SOLN
2.0000 g | INTRAVENOUS | Status: AC
  Administered 2018-07-18 – 2018-07-20 (×3): 2 g via INTRAVENOUS
  Filled 2018-07-18 (×3): qty 20

## 2018-07-18 MED ORDER — DEXTROSE 50 % IV SOLN
INTRAVENOUS | Status: AC
  Administered 2018-07-18: 25 mL
  Filled 2018-07-18: qty 50

## 2018-07-18 MED ORDER — ONDANSETRON HCL 4 MG PO TABS: 4.0000 mg | ORAL_TABLET | Freq: Four times a day (QID) | ORAL | Status: DC | PRN

## 2018-07-18 MED ORDER — VANCOMYCIN HCL IN DEXTROSE 1-5 GM/200ML-% IV SOLN
1000.0000 mg | Freq: Once | INTRAVENOUS | Status: AC
  Administered 2018-07-18: 1000 mg via INTRAVENOUS
  Filled 2018-07-18: qty 200

## 2018-07-18 MED ORDER — METRONIDAZOLE IN NACL 5-0.79 MG/ML-% IV SOLN
500.0000 mg | Freq: Three times a day (TID) | INTRAVENOUS | Status: AC
  Administered 2018-07-18 – 2018-07-20 (×8): 500 mg via INTRAVENOUS
  Filled 2018-07-18 (×9): qty 100

## 2018-07-18 MED ORDER — ONDANSETRON HCL 4 MG/2ML IJ SOLN
4.0000 mg | Freq: Four times a day (QID) | INTRAMUSCULAR | Status: DC | PRN
  Administered 2018-07-20: 4 mg via INTRAVENOUS
  Filled 2018-07-18 (×3): qty 2

## 2018-07-18 MED ORDER — OXYCODONE HCL 5 MG PO TABS: 5.0000 mg | ORAL_TABLET | ORAL | Status: DC | PRN

## 2018-07-18 MED ORDER — VANCOMYCIN HCL IN DEXTROSE 1-5 GM/200ML-% IV SOLN
1000.0000 mg | Freq: Three times a day (TID) | INTRAVENOUS | Status: AC
  Administered 2018-07-18 – 2018-07-22 (×14): 1000 mg via INTRAVENOUS
  Filled 2018-07-18 (×14): qty 200

## 2018-07-18 MED ORDER — KETOROLAC TROMETHAMINE 30 MG/ML IJ SOLN
30.0000 mg | Freq: Four times a day (QID) | INTRAMUSCULAR | Status: AC | PRN
  Administered 2018-07-19: 30 mg via INTRAVENOUS
  Filled 2018-07-18: qty 1

## 2018-07-18 MED ORDER — HEPARIN SODIUM (PORCINE) 5000 UNIT/ML IJ SOLN
5000.0000 [IU] | Freq: Three times a day (TID) | INTRAMUSCULAR | Status: DC
  Administered 2018-07-18 – 2018-07-23 (×15): 5000 [IU] via SUBCUTANEOUS
  Filled 2018-07-18 (×16): qty 1

## 2018-07-18 MED ORDER — ACETAMINOPHEN 650 MG RE SUPP: 650.0000 mg | Freq: Four times a day (QID) | RECTAL | Status: DC | PRN

## 2018-07-18 MED ORDER — KETOROLAC TROMETHAMINE 30 MG/ML IJ SOLN
30.0000 mg | Freq: Once | INTRAMUSCULAR | Status: AC
  Administered 2018-07-18: 30 mg via INTRAVENOUS
  Filled 2018-07-18: qty 1

## 2018-07-18 MED ORDER — ACETAMINOPHEN 325 MG PO TABS: 650.0000 mg | ORAL_TABLET | Freq: Four times a day (QID) | ORAL | Status: DC | PRN

## 2018-07-18 NOTE — Consult Note (Signed)
  ORTHOPAEDIC CONSULTATION  REQUESTING PHYSICIAN: Mathews, Elizabeth G, MD  Chief Complaint: Abscess osteomyelitis purulent drainage right foot  HPI: Kurt Anderson is a 47 y.o. 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  Patient is status post ray amputation.  Past Medical History:  Diagnosis Date  . Type 2 diabetes mellitus (HCC)    Past Surgical History:  Procedure Laterality Date  . AMPUTATION TOE Right 2018   Right big toe amputation.   Social History   Socioeconomic History  . Marital status: Single    Spouse name: Not on file  . Number of children: Not on file  . Years of education: Not on file  . Highest education level: Not on file  Occupational History  . Not on file  Social Needs  . Financial resource strain: Not on file  . Food insecurity:    Worry: Not on file    Inability: Not on file  . Transportation needs:    Medical: Not on file    Non-medical: Not on file  Tobacco Use  . Smoking status: Never Smoker  . Smokeless tobacco: Never Used  Substance and Sexual Activity  . Alcohol use: No  . Drug use: No  . Sexual activity: Not on file  Lifestyle  . Physical activity:    Days per week: Not on file    Minutes per session: Not on file  . Stress: Not on file  Relationships  . Social connections:    Talks on phone: Not on file    Gets together: Not on file    Attends religious service: Not on file    Active member of club or organization: Not on file    Attends meetings of clubs or organizations: Not on file    Relationship status: Not on file  Other Topics Concern  . Not on file  Social History Narrative  . Not on file   Family History  Problem Relation Age of Onset  . Hypertension Mother    - negative except otherwise stated in the family history section No Known Allergies Prior to Admission medications   Not on File   Dg Foot Complete  Right  Result Date: 07/17/2018 CLINICAL DATA:  Infected blister along the plantar aspect of the right foot. History of diabetes. Code sepsis. EXAM: RIGHT FOOT COMPLETE - 3+ VIEW COMPARISON:  None. FINDINGS: Soft tissues: There is marked soft tissue swelling of the included ankle and more so of the midfoot where there are scattered areas of subcutaneous soft tissue emphysema noted along the dorsum, medial and lateral aspect of the mid and forefoot. Soft tissue ulceration is seen along the plantar surface of the midfoot measuring approximately 2 cm in length and 6 mm deep. Forefoot: There has been amputation of the first ray at the base of the first metatarsal. The second through fourth distal and middle phalanges are intact. Osteoarthritic and erosive change of the second metatarsal head at the second MTP joint is identified with bony spurring at the base of the second proximal phalanx. The third through fifth MTP joints are intact. Midfoot: Bony destruction and disorganization of the midfoot is noted more markedly affecting the middle and lateral cuneiform with subchondral erosive change at the base of the medial cuneiform, tarsal navicular and cuboid. Laterally subluxed appearance of the third through fifth rays with widening between the second and third as well as first and second metatarsals is   noted. 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 Hindfoot: Prominent plantar calcaneal enthesophyte. Slight cortical irregularity along the medial talar dome may be secondary to osteoarthritis though the possibility of osteochondritis dissecans or other transchondral injury of the talar dome is not entirely excluded. IMPRESSION: 1. Marked soft tissue swelling of the  included ankle and more so of the midfoot with associated plantar soft tissue ulceration measuring 2 cm in length by 0.6 cm in depth at the level of the midfoot. 2. 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 However, superimposed on neuropathy is what appear be areas of irregular bone destruction specially involving the base of the second through fifth metatarsals raising concern for osteomyelitis. 3. Soft tissue emphysema is noted of the foot and the possibility of necrotizing fasciitis is also raised. 4. Talar dome irregularity along its medial aspect is suggested on the oblique view. Osteochondral defect might have this appearance or change secondary to advanced osteoarthritis across the tibiotalar joint. Electronically Signed   By: David  Kwon M.D.   On: 07/17/2018 21:54   Ct Extremity Lower Right W Contrast  Result Date: 07/18/2018 CLINICAL DATA:  Acute onset of right plantar foot wound, with swelling and drainage. Abnormal radiographs. Further evaluation requested. EXAM: CT OF THE LOWER RIGHT EXTREMITY WITH CONTRAST TECHNIQUE: Multidetector CT imaging of the lower right extremity was performed according to the standard protocol following intravenous contrast administration. COMPARISON:  Right foot radiographs performed earlier today at 9:30 p.m. CONTRAST:  100mL ISOVUE-300 IOPAMIDOL (ISOVUE-300) INJECTION 61% FINDINGS: Bones/Joint/Cartilage There is diffuse bony destruction involving the midfoot, with callus formation about the bases of the metatarsals and diffuse osseous erosions, compatible with osteomyelitis. Air is noted tracking into much of the cuboid. Underlying changes of Charcot joint are again suggested. Associated soft tissue air tracks about the midfoot and forefoot, and into the distal second metatarsal. Soft tissue air tracks proximally about the  distal tibia and lateral to the proximal fibula, with mild soft tissue air noted overlying the medial gastrocnemius musculature just below the level of the knee. Trace associated fluid is noted. This is suspicious for diffuse infection with a gas producing organism; necrotizing fasciitis is a concern. No well-defined joint effusion is identified, though extensive phlegmon and fluid are noted about the midfoot. Ligaments Suboptimally assessed by CT. Muscles and Tendons Not well assessed at the midfoot due to the extent of soft tissue inflammation. The visualized musculature and tendons are grossly unremarkable. Soft tissues A soft tissue laceration is noted along the plantar aspect of the foot. Extensive soft tissue swelling is noted about the midfoot and forefoot, with scattered soft tissue air as described above. Vague intramuscular edema tracks along the right lower leg, corresponding to scattered soft tissue air and fluid as described above. IMPRESSION: 1. Diffuse bony destruction involving the midfoot, with callus formation about the bases of the metatarsals and diffuse osseous erosions, compatible with osteomyelitis superimposed on changes of Charcot joint. Air tracks about the midfoot and forefoot, and into the distal second metatarsal. 2. Soft tissue air tracks proximally about the distal tibia and lateral to the proximal fibula, with mild soft tissue air overlying the medial gastrocnemius musculature just   below the level of the knee. Trace associated fluid noted. This is suspicious for diffuse infection with a gas producing organism. Necrotizing fasciitis is a concern. 3. Vague intramuscular edema tracks along the right lower leg, corresponding to scattered soft tissue air and fluid described above. 4. Soft tissue laceration along the plantar aspect of the foot. These results were called by telephone at the time of interpretation on 07/18/2018 at 12:10 am to Dr. JASON MESNER, who verbally acknowledged these  results. Electronically Signed   By: Jeffery  Chang M.D.   On: 07/18/2018 00:11   Us Abdomen Limited Ruq  Result Date: 07/18/2018 CLINICAL DATA:  47-year-old male with abnormal LFTs. Initial encounter. EXAM: ULTRASOUND ABDOMEN LIMITED RIGHT UPPER QUADRANT COMPARISON:  05/31/2012 CT. FINDINGS: Gallbladder: Tumefactive sludge. No gallbladder wall thickening. Patient not tender over the gallbladder during scanning per sonographer. Common bile duct: Diameter: 3.4 mm proximally. Distal aspect not visualized secondary to bowel gas. Liver: Liver of increased echogenicity consistent with fatty infiltration and/or hepatocellular disease. No focal hepatic lesion. Portal vein is patent on color Doppler imaging with normal direction of blood flow towards the liver. IMPRESSION: 1. Tumefactive gallbladder sludge. No sonographic evidence of gallbladder inflammation. 2. Liver of increased echogenicity consistent with fatty infiltration and/or hepatocellular disease. No focal hepatic lesion noted. Electronically Signed   By: Steven  Olson M.D.   On: 07/18/2018 07:08   - pertinent xrays, CT, MRI studies were reviewed and independently interpreted  Positive ROS: All other systems have been reviewed and were otherwise negative with the exception of those mentioned in the HPI and as above.  Physical Exam: General: Alert, no acute distress Psychiatric: Patient is competent for consent with normal mood and affect Lymphatic: No axillary or cervical lymphadenopathy Cardiovascular: No pedal edema Respiratory: No cyanosis, no use of accessory musculature GI: No organomegaly, abdomen is soft and non-tender    Images:  @ENCIMAGES@  Labs:  Lab Results  Component Value Date   HGBA1C 7.4 (H) 07/17/2018   CRP 28.0 (H) 07/17/2018   REPTSTATUS PENDING 07/17/2018   CULT  07/17/2018    NO GROWTH < 12 HOURS Performed at [facility], [address] [place], [place] [address]    LABORGA STAPHYLOCOCCUS AUREUS 05/31/2012      Lab Results  Component Value Date   ALBUMIN 2.8 (L) 07/17/2018    Neurologic: Patient does not have protective sensation bilateral lower extremities.   MUSCULOSKELETAL:   Skin: Examination patient has purulent drainage from the ulcer on the rocker-bottom deformity of the right foot.  This probes all the way down to destructive necrotic bone.  Patient has good hair growth down to his ankle.  He has good capillary refill in the right foot.  Radiographs show destructive bony changes with Charcot arthropathy.  There is no ascending cellulitis.  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  Plan: Plan: We will plan for a right transtibial amputation tomorrow.  Anticipate patient should be able to be discharged to home with a wound VAC and a stump shrinker. Moderate caloric malnutrition.  Thank you for the consult and the opportunity to see Mr. Anderson  Birney Belshe, MD [facility] [phone] 5:44 PM     

## 2018-07-18 NOTE — H&P (Signed)
History and Physical    Kurt Anderson ZOX:[id number] DOB: Feb 14, 1971 DOA: 07/17/2018  PCP: Ardyth Man, MD   Patient coming from: Home.  I have personally briefly reviewed patient's old medical records in [facility]  Chief Complaint: Right foot wound x 3 days.  HPI: Kurt Anderson is a 47 y.o. male with medical history significant of type 2 diabetes, right big toe amputation in 2018 who is coming to the emergency department with complaints of developing a blister on his right sole about 3 days ago, which subsequently filled up with purulent, foul-smelling fluid, which has been draining for the past couple days.  He has developed another blister on the dorsal/lateral aspect of his right foot.  He denies trauma to the area.  He denies fever, chills, but feels fatigue and malaise.  No headache, sore throat, dyspnea, hemoptysis, wheezing, chest pain, palpitations, dizziness, diaphoresis, PND, orthopnea or pitting edema of the lower extremities.  Denies abdominal pain, nausea, emesis, diarrhea, constipation, melena or hematochezia.  Denies dysuria, frequency or hematuria.  Denies polyuria, polydipsia, polyphagia or blurred vision.  He states that his blood glucose has been below 160s in the evenings, when he usually checks it.  Denies skin pruritus.  ED Course: His initial temperature was 101.3 F, pulse 108, respirations 22, blood pressure 99/67 mmHg and O2 sat 99% on room air.  He received a 2000 mL NS bolus, hydromorphone 1 mg IVP, thousand milligrams of vancomycin, 3.375 g of Zosyn and 600 mg of clindamycin IVPB.  I added Toradol 30 mg IVP x1 dose.  His urinalysis showed an amber color and proteinuria 100 mg/dL.  CBC showed a white count of 16.4 with 79% neutrophils, 11% lymphocytes and 8% monocytes.  Hemoglobin is 12.7 g/dL and platelets 811.  Lactic acid #1 was 1.45 and lactic acid number two 1.14, sodium 130, potassium 4.3, chloride 94, CO2 26 mmol S/L.  BUN was 22, creatinine  1.23, glucose 162, calcium 8.4 mg/dL.  Total protein 8.1, albumin 2.8 g/dL.  AST was 53, ALT 49 and alkaline phosphatase 150 units/L.  Total bilirubin was 1.5 mg/dL.  Imaging: Right foot x-ray showed marked soft tissue swelling of the right foot associated with plantar soft tissue ulceration measuring 2 cm x 0 point centimeters in depth at the level of the midfoot.  There was upon destruction and disorganization of the midfoot articulation with lateral subluxation of the third fifth, relative to the midfoot which could represent an estimate of Charcot neuropathy.  There is soft tissue emphysema noted on the foot and the possibility of necrotizing fasciitis x-rays.  Tolerated on irregularity along his medial aspect he suggests this on the oblique view.  These findings are better visualized on CT of the right lower extremity.  Please see images and full radiology report for further detail.  Review of Systems: As per HPI otherwise 10 point review of systems negative.   Past Medical History:  Diagnosis Date  . Type 2 diabetes mellitus (HCC)     Past Surgical History:  Procedure Laterality Date  . AMPUTATION TOE Right 2018   Right big toe amputation.     reports that he has never smoked. He has never used smokeless tobacco. He reports that he does not drink alcohol or use drugs.  No Known Allergies  Family History  Problem Relation Age of Onset  . Hypertension Mother     Prior to Admission medications   Not on File    Physical Exam: Vitals:  07/17/18 2337 07/18/18 0000 07/18/18 0030 07/18/18 0102  BP:  103/67 105/66 105/67  Pulse:  78 77 73  Resp:  17 15 17   Temp:      TempSrc:      SpO2:  100% 97% 99%  Weight: 98.9 kg (218 lb)     Height: 5\' 7"  (1.702 m)       Constitutional: NAD, calm, comfortable Eyes: PERRL, lids and conjunctivae normal ENMT: Mucous membranes are moist. Posterior pharynx clear of any exudate or lesions. Neck: normal, supple, no masses, no  thyromegaly Respiratory: clear to auscultation bilaterally, no wheezing, no crackles. Normal respiratory effort. No accessory muscle use.  Cardiovascular: Regular rate and rhythm, no murmurs / rubs / gallops. No extremity edema. 2+ pedal pulses. No carotid bruits.  Abdomen: no tenderness, no masses palpated. No hepatosplenomegaly. Bowel sounds positive.  Musculoskeletal: no clubbing / cyanosis.  Right distal first MTP amputation. Good ROM, no contractures. Normal muscle tone.  Skin: Right plantar and lateral dorsal food aspect wounds with foul-smelling discharge, surrounding erythema, edema, calor and mild tenderness to palpation.  See pictures below for further detail. Neurologic: CN 2-12 grossly intact. Sensation intact, DTR normal. Strength 5/5 in all 4.  Psychiatric: Normal judgment and insight. Alert and oriented x 3. Normal mood.             Labs on Admission: I have personally reviewed following labs and imaging studies  CBC: Recent Labs  Lab 07/17/18 2102  WBC 16.4*  NEUTROABS 12.9*  HGB 12.7*  HCT 37.1*  MCV 83.9  PLT 434*   Basic Metabolic Panel: Recent Labs  Lab 07/17/18 2102  NA 130*  K 4.3  CL 94*  CO2 26  GLUCOSE 162*  BUN 22*  CREATININE 1.23  CALCIUM 8.4*   GFR: Estimated Creatinine Clearance: 84.1 mL/min (by C-G formula based on SCr of 1.23 mg/dL). Liver Function Tests: Recent Labs  Lab 07/17/18 2102  AST 53*  ALT 49*  ALKPHOS 151*  BILITOT 1.5*  PROT 8.1  ALBUMIN 2.8*   No results for input(s): LIPASE, AMYLASE in the last 168 hours. No results for input(s): AMMONIA in the last 168 hours. Coagulation Profile: No results for input(s): INR, PROTIME in the last 168 hours. Cardiac Enzymes: No results for input(s): CKTOTAL, CKMB, CKMBINDEX, TROPONINI in the last 168 hours. BNP (last 3 results) No results for input(s): PROBNP in the last 8760 hours. HbA1C: No results for input(s): HGBA1C in the last 72 hours. CBG: No results for  input(s): GLUCAP in the last 168 hours. Lipid Profile: No results for input(s): CHOL, HDL, LDLCALC, TRIG, CHOLHDL, LDLDIRECT in the last 72 hours. Thyroid Function Tests: No results for input(s): TSH, T4TOTAL, FREET4, T3FREE, THYROIDAB in the last 72 hours. Anemia Panel: No results for input(s): VITAMINB12, FOLATE, FERRITIN, TIBC, IRON, RETICCTPCT in the last 72 hours. Urine analysis:    Component Value Date/Time   COLORURINE AMBER (A) 07/17/2018 2330   APPEARANCEUR CLEAR 07/17/2018 2330   LABSPEC 1.029 07/17/2018 2330   PHURINE 5.0 07/17/2018 2330   GLUCOSEU NEGATIVE 07/17/2018 2330   HGBUR NEGATIVE 07/17/2018 2330   BILIRUBINUR NEGATIVE 07/17/2018 2330   KETONESUR NEGATIVE 07/17/2018 2330   PROTEINUR 100 (A) 07/17/2018 2330   UROBILINOGEN 0.2 05/31/2012 0712   NITRITE NEGATIVE 07/17/2018 2330   LEUKOCYTESUR NEGATIVE 07/17/2018 2330    Radiological Exams on Admission: Dg Foot Complete Right  Result Date: 07/17/2018 CLINICAL DATA:  Infected blister along the plantar aspect of the right foot. History of  diabetes. Code sepsis. EXAM: RIGHT FOOT COMPLETE - 3+ VIEW COMPARISON:  None. FINDINGS: Soft tissues: There is marked soft tissue swelling of the included ankle and more so of the midfoot where there are scattered areas of subcutaneous soft tissue emphysema noted along the dorsum, medial and lateral aspect of the mid and forefoot. Soft tissue ulceration is seen along the plantar surface of the midfoot measuring approximately 2 cm in length and 6 mm deep. Forefoot: There has been amputation of the first ray at the base of the first metatarsal. The second through fourth distal and middle phalanges are intact. Osteoarthritic and erosive change of the second metatarsal head at the second MTP joint is identified with bony spurring at the base of the second proximal phalanx. The third through fifth MTP joints are intact. Midfoot: Bony destruction and disorganization of the midfoot is noted more  markedly affecting the middle and lateral cuneiform with subchondral erosive change at the base of the medial cuneiform, tarsal navicular and cuboid. Laterally subluxed appearance of the third through fifth rays with widening between the second and third as well as first and second metatarsals is noted. 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 Hindfoot: Prominent plantar calcaneal enthesophyte. Slight cortical irregularity along the medial talar dome may be secondary to osteoarthritis though the possibility of osteochondritis dissecans or other transchondral injury of the talar dome is not entirely excluded. IMPRESSION: 1. Marked soft tissue swelling of the included ankle and more so of the midfoot with associated plantar soft tissue ulceration measuring 2 cm in length by 0.6 cm in depth at the level of the midfoot. 2. 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 However, superimposed on neuropathy is what appear be areas of irregular bone destruction specially involving the base of the second through fifth metatarsals raising concern for osteomyelitis. 3. Soft tissue emphysema is noted of the foot and the possibility of necrotizing fasciitis is also raised. 4. Talar dome irregularity along its medial aspect is suggested on the oblique view. Osteochondral defect might have this appearance or change secondary to advanced osteoarthritis across the tibiotalar joint. Electronically Signed   By: Tollie Ethavid  Kwon M.D.   On: 07/17/2018 21:54   Ct Extremity Lower Right W Contrast  Result Date: 07/18/2018 CLINICAL DATA:  Acute onset of  right plantar foot wound, with swelling and drainage. Abnormal radiographs. Further evaluation requested. EXAM: CT OF THE LOWER RIGHT EXTREMITY WITH CONTRAST TECHNIQUE: Multidetector CT imaging of the lower right extremity was performed according to the standard protocol following intravenous contrast administration. COMPARISON:  Right foot radiographs performed earlier today at 9:30 p.m. CONTRAST:  100mL ISOVUE-300 IOPAMIDOL (ISOVUE-300) INJECTION 61% FINDINGS: Bones/Joint/Cartilage There is diffuse bony destruction involving the midfoot, with callus formation about the bases of the metatarsals and diffuse osseous erosions, compatible with osteomyelitis. Air is noted tracking into much of the cuboid. Underlying changes of Charcot joint are again suggested. Associated soft tissue air tracks about the midfoot and forefoot, and into the distal second metatarsal. Soft tissue air tracks proximally about the distal tibia and lateral to the proximal fibula, with mild soft tissue air noted overlying the medial gastrocnemius musculature just below the  level of the knee. Trace associated fluid is noted. This is suspicious for diffuse infection with a gas producing organism; necrotizing fasciitis is a concern. No well-defined joint effusion is identified, though extensive phlegmon and fluid are noted about the midfoot. Ligaments Suboptimally assessed by CT. Muscles and Tendons Not well assessed at the midfoot due to the extent of soft tissue inflammation. The visualized musculature and tendons are grossly unremarkable. Soft tissues A soft tissue laceration is noted along the plantar aspect of the foot. Extensive soft tissue swelling is noted about the midfoot and forefoot, with scattered soft tissue air as described above. Vague intramuscular edema tracks along the right lower leg, corresponding to scattered soft tissue air and fluid as described above. IMPRESSION: 1. Diffuse bony destruction involving the midfoot, with  callus formation about the bases of the metatarsals and diffuse osseous erosions, compatible with osteomyelitis superimposed on changes of Charcot joint. Air tracks about the midfoot and forefoot, and into the distal second metatarsal. 2. Soft tissue air tracks proximally about the distal tibia and lateral to the proximal fibula, with mild soft tissue air overlying the medial gastrocnemius musculature just below the level of the knee. Trace associated fluid noted. This is suspicious for diffuse infection with a gas producing organism. Necrotizing fasciitis is a concern. 3. Vague intramuscular edema tracks along the right lower leg, corresponding to scattered soft tissue air and fluid described above. 4. Soft tissue laceration along the plantar aspect of the foot. These results were called by telephone at the time of interpretation on 07/18/2018 at 12:10 am to Dr. Marily Memos, who verbally acknowledged these results. Electronically Signed   By: Roanna Raider M.D.   On: 07/18/2018 00:11    EKG: Independently reviewed.  Vent. rate 95 BPM PR interval * ms QRS duration 120 ms QT/QTc 374/471 ms P-R-T axes 51 40 18 Sinus rhythm Probable left atrial enlargement Nonspecific intraventricular conduction delay Minimal ST elevation, anterior leads, likely BRP  Assessment/Plan Principal Problem:   Acute osteomyelitis of right foot (HCC) Admit to [facility]/Inpatient/Telemetry. Keep n.p.o. Check prealbumin, sed rate and CRP. Check ABI with.without TBI Continue Toradol 30 mg IVP every 6 hours as needed. Oxycodone 5 mg every 4 hours as needed. Continue vancomycin per pharmacy. Start ceftriaxone 2 g IVPB every 24 hours. Start metronidazole 500 mg IVPB every 8 hours. Orthopedic surgery to evaluate later today.  Active Problems:   Type 2 diabetes mellitus (HCC) Hemoglobin A1c still pending. Currently n.p.o. CBG monitoring every 6 hours.    Anemia Check anemia panel. Monitor hematocrit and hemoglobin.     Abnormal liver function tests Check acute hepatitis panel. Check RUQ ultrasound.    Hyponatremia Likely due to decreased oral intake. Check urine sodium.. Normal saline infusion. Follow-up sodium level. Consider further work-up if it persists.    Hypoalbuminemia Likely multifactorial. Check prealbumin. Follow-up Albumin level as needed.    Proteinuria Likely due to diabetic nep hropathy. Should obtain 24-hour urine albumin after discharge. Should follow or establish with nephrology.    DVT prophylaxis: Heparin SQ. Code Status: Full code. Family Communication: None at bedside. Disposition Plan: Admit to [facility] for IV antibiotic therapy and orthopedic surgery consult. Consults called: Dr. Eulah Pont from orthopedic surgery will see the patient at [facility]. Admission status: Inpatient/telemetry.   Bobette Mo MD [facility] Pager [phone].  If 7PM-7AM, please contact night-coverage [email or web address] Password [email or web address]  07/18/2018, 1:30 AM   This document was prepared using Dragon voice recognition software and may contain some unintended transcription  errors.

## 2018-07-18 NOTE — Progress Notes (Signed)
Pharmacy Antibiotic Note  Judi CongGabriel Silva-Galvan is a 47 y.o. male [facility] with right foot wound for 3 days.  Patient was given Zosyn and clindamycin in the ED and then replaced by Rocephin and Flagyl.  Imaging concerning for osteomyelitis and necrotizing fasciitis.  Pharmacy has been consulted for vancomycin dosing.  SCr 1.23, CrCL 84 ml/min, afebrile, WBC 16.4.   Plan: Vanc 1gm IV Q8H Rocephin and Flagyl per MD Monitor renal fxn, micro data, vanc trough at [facility]   Height: 5\' 7"  (170.2 cm) Weight: 218 lb (98.9 kg) IBW/kg (Calculated) : 66.1  Temp (24hrs), Avg:100.3 F (37.9 C), Min:98.4 F (36.9 C), Max:101.4 F (38.6 C)  Recent Labs  Lab 07/17/18 2102 07/17/18 2112 07/17/18 2309  WBC 16.4*  --   --   CREATININE 1.23  --   --   LATICACIDVEN  --  1.45 1.14    Estimated Creatinine Clearance: 84.1 mL/min (by C-G formula based on SCr of 1.23 mg/dL).    No Known Allergies   Vanc 8/5 >> CTX 8/6 >> Flagyl 8/6 >>  8/5 BCx -   Boni Maclellan D. Laney Potashang, PharmD, BCPS, BCCCP 07/18/2018, 10:35 AM

## 2018-07-18 NOTE — Progress Notes (Signed)
Inpatient Diabetes Program Recommendations  AACE/ADA: New Consensus Statement on Inpatient Glycemic Control (2015)  Target Ranges:  Prepandial:   less than 140 mg/dL      Peak postprandial:   less than 180 mg/dL (1-2 hours)      Critically ill patients:  140 - 180 mg/dL   Review of Glycemic Control  Diabetes history: DM 2 Outpatient Diabetes medications: None Current orders for Inpatient glycemic control: None  Inpatient Diabetes Program Recommendations:    Consult for lower extremity wound. Glucose trends since admission 66-117 mg/dl. A1c in process. Will watch trends while here.  Thanks,  Christena DeemShannon Latima Hamza RN, MSN, BC-ADM Inpatient Diabetes Coordinator Team Pager [phone] (8a-5p)

## 2018-07-18 NOTE — Consult Note (Signed)
ORTHOPAEDIC CONSULTATION  REQUESTING PHYSICIAN: Georgette Shell, MD  Chief Complaint: R foot wound  HPI: Kurt Anderson is a 47 y.o. male who complains of R foot wound with Type 2 DM. He had a 1st toe amp in 2018. He has had a plantar foot wound for roughly 1 wk. He has had chronic wounds in the past. Denies leg pain.   Past Medical History:  Diagnosis Date  . Type 2 diabetes mellitus ([place])    Past Surgical History:  Procedure Laterality Date  . AMPUTATION TOE Right 2018   Right big toe amputation.   Social History   Socioeconomic History  . Marital status: Single    Spouse name: Not on file  . Number of children: Not on file  . Years of education: Not on file  . Highest education level: Not on file  Occupational History  . Not on file  Social Needs  . Financial resource strain: Not on file  . Food insecurity:    Worry: Not on file    Inability: Not on file  . Transportation needs:    Medical: Not on file    Non-medical: Not on file  Tobacco Use  . Smoking status: Never Smoker  . Smokeless tobacco: Never Used  Substance and Sexual Activity  . Alcohol use: No  . Drug use: No  . Sexual activity: Not on file  Lifestyle  . Physical activity:    Days per week: Not on file    Minutes per session: Not on file  . Stress: Not on file  Relationships  . Social connections:    Talks on phone: Not on file    Gets together: Not on file    Attends religious service: Not on file    Active member of club or organization: Not on file    Attends meetings of clubs or organizations: Not on file    Relationship status: Not on file  Other Topics Concern  . Not on file  Social History Narrative  . Not on file   Family History  Problem Relation Age of Onset  . Hypertension Mother    No Known Allergies Prior to Admission medications   Not on File   Dg Foot Complete Right  Result Date: 07/17/2018 CLINICAL DATA:  Infected blister along the plantar aspect  of the right foot. History of diabetes. Code sepsis. EXAM: RIGHT FOOT COMPLETE - 3+ VIEW COMPARISON:  None. FINDINGS: Soft tissues: There is marked soft tissue swelling of the included ankle and more so of the midfoot where there are scattered areas of subcutaneous soft tissue emphysema noted along the dorsum, medial and lateral aspect of the mid and forefoot. Soft tissue ulceration is seen along the plantar surface of the midfoot measuring approximately 2 cm in length and 6 mm deep. Forefoot: There has been amputation of the first ray at the base of the first metatarsal. The second through fourth distal and middle phalanges are intact. Osteoarthritic and erosive change of the second metatarsal head at the second MTP joint is identified with bony spurring at the base of the second proximal phalanx. The third through fifth MTP joints are intact. Midfoot: Bony destruction and disorganization of the midfoot is noted more markedly affecting the middle and lateral cuneiform with subchondral erosive change at the base of the medial cuneiform, tarsal navicular and cuboid. Laterally subluxed appearance of the third through fifth rays with widening between the second and third as well as first and  second metatarsals is noted. 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 Hindfoot: Prominent plantar calcaneal enthesophyte. Slight cortical irregularity along the medial talar dome may be secondary to osteoarthritis though the possibility of osteochondritis dissecans or other transchondral injury of the talar dome is not entirely excluded. IMPRESSION: 1. Marked soft tissue swelling of the included ankle and more so of the midfoot with associated plantar soft tissue ulceration  measuring 2 cm in length by 0.6 cm in depth at the level of the midfoot. 2. 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 However, superimposed on neuropathy is what appear be areas of irregular bone destruction specially involving the base of the second through fifth metatarsals raising concern for osteomyelitis. 3. Soft tissue emphysema is noted of the foot and the possibility of necrotizing fasciitis is also raised. 4. Talar dome irregularity along its medial aspect is suggested on the oblique view. Osteochondral defect might have this appearance or change secondary to advanced osteoarthritis across the tibiotalar joint. Electronically Signed   By: Ashley Royalty M.D.   On: 07/17/2018 21:54   Ct Extremity Lower Right W Contrast  Result Date: 07/18/2018 CLINICAL DATA:  Acute onset of right plantar foot wound, with swelling and drainage. Abnormal radiographs. Further evaluation requested. EXAM: CT OF THE LOWER RIGHT EXTREMITY WITH CONTRAST TECHNIQUE: Multidetector CT imaging of the lower right extremity was performed according to the standard protocol following intravenous contrast administration. COMPARISON:  Right foot radiographs performed earlier today at 9:30 p.m. CONTRAST:  164m ISOVUE-300 IOPAMIDOL (ISOVUE-300) INJECTION 61% FINDINGS: Bones/Joint/Cartilage There is diffuse bony destruction involving the midfoot, with callus formation about the bases of the metatarsals and diffuse osseous erosions, compatible with osteomyelitis. Air is noted tracking into much of the cuboid. Underlying changes of Charcot joint are again suggested. Associated soft tissue air tracks about the midfoot and forefoot, and into the distal second metatarsal. Soft tissue air tracks proximally about the distal tibia and lateral to the proximal fibula, with mild soft tissue air noted overlying the  medial gastrocnemius musculature just below the level of the knee. Trace associated fluid is noted. This is suspicious for diffuse infection with a gas producing organism; necrotizing fasciitis is a concern. No well-defined joint effusion is identified, though extensive phlegmon and fluid are noted about the midfoot. Ligaments Suboptimally assessed by CT. Muscles and Tendons Not well assessed at the midfoot due to the extent of soft tissue inflammation. The visualized musculature and tendons are grossly unremarkable. Soft tissues A soft tissue laceration is noted along the plantar aspect of the foot. Extensive soft tissue swelling is noted about the midfoot and forefoot, with scattered soft tissue air as described above. Vague intramuscular edema tracks along the right lower leg, corresponding to scattered soft tissue air and fluid as described above. IMPRESSION: 1. Diffuse bony destruction involving the midfoot, with callus formation about the bases of the metatarsals and diffuse osseous erosions, compatible with osteomyelitis superimposed on changes of Charcot joint. Air tracks about the midfoot and forefoot, and into the distal second metatarsal. 2. Soft tissue air tracks proximally about the distal tibia and lateral to the proximal fibula, with mild soft tissue air overlying the medial  gastrocnemius musculature just below the level of the knee. Trace associated fluid noted. This is suspicious for diffuse infection with a gas producing organism. Necrotizing fasciitis is a concern. 3. Vague intramuscular edema tracks along the right lower leg, corresponding to scattered soft tissue air and fluid described above. 4. Soft tissue laceration along the plantar aspect of the foot. These results were called by telephone at the time of interpretation on 07/18/2018 at 12:10 am to Dr. Merrily Pew, who verbally acknowledged these results. Electronically Signed   By: Garald Balding M.D.   On: 07/18/2018 00:11   US Abdomen  Limited Ruq  Result Date: 07/18/2018 CLINICAL DATA:  47 year old male with abnormal LFTs. Initial encounter. EXAM: ULTRASOUND ABDOMEN LIMITED RIGHT UPPER QUADRANT COMPARISON:  05/31/2012 CT. FINDINGS: Gallbladder: Tumefactive sludge. No gallbladder wall thickening. Patient not tender over the gallbladder during scanning per sonographer. Common bile duct: Diameter: 3.4 mm proximally. Distal aspect not visualized secondary to bowel gas. Liver: Liver of increased echogenicity consistent with fatty infiltration and/or hepatocellular disease. No focal hepatic lesion. Portal vein is patent on color Doppler imaging with normal direction of blood flow towards the liver. IMPRESSION: 1. Tumefactive gallbladder sludge. No sonographic evidence of gallbladder inflammation. 2. Liver of increased echogenicity consistent with fatty infiltration and/or hepatocellular disease. No focal hepatic lesion noted. Electronically Signed   By: Genia Del M.D.   On: 07/18/2018 07:08    Positive ROS: All other systems have been reviewed and were otherwise negative with the exception of those mentioned in the HPI and as above.  Labs cbc Recent Labs    07/17/18 2102  WBC 16.4*  HGB 12.7*  HCT 37.1*  PLT 434*    Labs inflam No results for input(s): CRP in the last 72 hours.  Invalid input(s): ESR  Labs coag No results for input(s): INR, PTT in the last 72 hours.  Invalid input(s): PT  Recent Labs    07/17/18 2102  NA 130*  K 4.3  CL 94*  CO2 26  GLUCOSE 162*  BUN 22*  CREATININE 1.23  CALCIUM 8.4*    Physical Exam: Vitals:   07/18/18 0230 07/18/18 0327  BP: 112/65 105/64  Pulse: 69 70  Resp: 17 16  Temp:  98.4 F (36.9 C)  SpO2: 100% 100%   General: Alert, no acute distress Cardiovascular: No pedal edema Respiratory: No cyanosis, no use of accessory musculature GI: No organomegaly, abdomen is soft and non-tender Skin: No lesions in the area of chief complaint other than those listed below in  MSK exam.  Neurologic: Sensation intact distally save for the below mentioned MSK exam Psychiatric: Patient is competent for consent with normal mood and affect Lymphatic: No axillary or cervical lymphadenopathy  MUSCULOSKELETAL:  RLE: foot wound present in clean dressing. His leg is soft with no warmth or erythema. No Tenderness to deep palpation. Painless motion at knee and ankle. Other extremities are atraumatic with painless ROM and NVI.  Assessment: Right foot wound.  I see no signs of a gas producing or necrotizing infection in the RLE. Small amount of free gas possibly due to chronic open wound.   Plan: Will discuss with Dr. Sharol Given Continue Abx for now   Renette Butters, MD Cell [phone]   07/18/2018 7:28 AM

## 2018-07-18 NOTE — Progress Notes (Signed)
CRITICAL VALUE ALERT  Critical Value:  3366  Date & Time Notied:  07/18/2018 07:02  Provider Notified: Dr. Ashley RoyaltyMatthews  Orders Received/Actions taken: 12.5g D50 given, rechecked CBG at 07:00 117, MD notified.

## 2018-07-18 NOTE — Plan of Care (Signed)

## 2018-07-18 NOTE — Consult Note (Addendum)
WOC Nurse wound consult note WOC consult requested for extensive foot wounds prior to ortho service involvement. X-rays indicate; "Findings likely represent stigmata of Charcot neuropathy the presence of soft tissue swelling and emphysema raise concern for superimposed osteomyelitis of the midfoot more so involving the base of the second through fifth metatarsals with bone destruction seen as well as of the middle Cuneiform." This complex medical condition is beyond WOC scope of practice. Review their progress notes and please refer to ortho team for further plan of care. Please re-consult if further assistance is needed.  Thank-you,  Cammie Mcgeeawn Torianna Junio MSN, RN, CWOCN, [place], CNS [phone]

## 2018-07-18 NOTE — Plan of Care (Signed)

## 2018-07-18 NOTE — Plan of Care (Signed)
47 year old male with a history of type 2 diabetes right big toe amputation in 2018 admitted this morning with right foot wound on the plantar aspect with the purulent foul-smelling fluid for the past couple of days.  He reported it has been there there for 3 days but it does look like it has been there for few weeks probably.  He was started on vancomycin, Rocephin, and Flagyl.  X-ray showed findings consistent with possible acute osteomyelitis of the right foot.  Dr. Eulah PontMurphy from Ortho has seen the patient and will discuss with Dr. Lajoyce Cornersuda about the plan of care.  Continue current antibiotics for now.  His blood sugar was low this morning at 66 improved 117 after giving orange juice.Follow labs continue antibiotics.

## 2018-07-18 NOTE — H&P (View-Only) (Signed)
ORTHOPAEDIC CONSULTATION  REQUESTING PHYSICIAN: Alwyn Ren, MD  Chief Complaint: Abscess osteomyelitis purulent drainage right foot  HPI: Kurt Anderson is a 47 y.o. 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  Patient is status post ray amputation.  Past Medical History:  Diagnosis Date  . Type 2 diabetes mellitus ([facility])    Past Surgical History:  Procedure Laterality Date  . AMPUTATION TOE Right 2018   Right big toe amputation.   Social History   Socioeconomic History  . Marital status: Single    Spouse name: Not on file  . Number of children: Not on file  . Years of education: Not on file  . Highest education level: Not on file  Occupational History  . Not on file  Social Needs  . Financial resource strain: Not on file  . Food insecurity:    Worry: Not on file    Inability: Not on file  . Transportation needs:    Medical: Not on file    Non-medical: Not on file  Tobacco Use  . Smoking status: Never Smoker  . Smokeless tobacco: Never Used  Substance and Sexual Activity  . Alcohol use: No  . Drug use: No  . Sexual activity: Not on file  Lifestyle  . Physical activity:    Days per week: Not on file    Minutes per session: Not on file  . Stress: Not on file  Relationships  . Social connections:    Talks on phone: Not on file    Gets together: Not on file    Attends religious service: Not on file    Active member of club or organization: Not on file    Attends meetings of clubs or organizations: Not on file    Relationship status: Not on file  Other Topics Concern  . Not on file  Social History Narrative  . Not on file   Family History  Problem Relation Age of Onset  . Hypertension Mother    - negative except otherwise stated in the family history section No Known Allergies Prior to Admission medications   Not on File   Dg Foot Complete  Right  Result Date: 07/17/2018 CLINICAL DATA:  Infected blister along the plantar aspect of the right foot. History of diabetes. Code sepsis. EXAM: RIGHT FOOT COMPLETE - 3+ VIEW COMPARISON:  None. FINDINGS: Soft tissues: There is marked soft tissue swelling of the included ankle and more so of the midfoot where there are scattered areas of subcutaneous soft tissue emphysema noted along the dorsum, medial and lateral aspect of the mid and forefoot. Soft tissue ulceration is seen along the plantar surface of the midfoot measuring approximately 2 cm in length and 6 mm deep. Forefoot: There has been amputation of the first ray at the base of the first metatarsal. The second through fourth distal and middle phalanges are intact. Osteoarthritic and erosive change of the second metatarsal head at the second MTP joint is identified with bony spurring at the base of the second proximal phalanx. The third through fifth MTP joints are intact. Midfoot: Bony destruction and disorganization of the midfoot is noted more markedly affecting the middle and lateral cuneiform with subchondral erosive change at the base of the medial cuneiform, tarsal navicular and cuboid. Laterally subluxed appearance of the third through fifth rays with widening between the second and third as well as first and second metatarsals is  noted. 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 Hindfoot: Prominent plantar calcaneal enthesophyte. Slight cortical irregularity along the medial talar dome may be secondary to osteoarthritis though the possibility of osteochondritis dissecans or other transchondral injury of the talar dome is not entirely excluded. IMPRESSION: 1. Marked soft tissue swelling of the  included ankle and more so of the midfoot with associated plantar soft tissue ulceration measuring 2 cm in length by 0.6 cm in depth at the level of the midfoot. 2. 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 However, superimposed on neuropathy is what appear be areas of irregular bone destruction specially involving the base of the second through fifth metatarsals raising concern for osteomyelitis. 3. Soft tissue emphysema is noted of the foot and the possibility of necrotizing fasciitis is also raised. 4. Talar dome irregularity along its medial aspect is suggested on the oblique view. Osteochondral defect might have this appearance or change secondary to advanced osteoarthritis across the tibiotalar joint. Electronically Signed   By: Tollie Eth M.D.   On: 07/17/2018 21:54   Ct Extremity Lower Right W Contrast  Result Date: 07/18/2018 CLINICAL DATA:  Acute onset of right plantar foot wound, with swelling and drainage. Abnormal radiographs. Further evaluation requested. EXAM: CT OF THE LOWER RIGHT EXTREMITY WITH CONTRAST TECHNIQUE: Multidetector CT imaging of the lower right extremity was performed according to the standard protocol following intravenous contrast administration. COMPARISON:  Right foot radiographs performed earlier today at 9:30 p.m. CONTRAST:  ISOVUE-300 IOPAMIDOL (ISOVUE-300) INJECTION 61% FINDINGS: Bones/Joint/Cartilage There is diffuse bony destruction involving the midfoot, with callus formation about the bases of the metatarsals and diffuse osseous erosions, compatible with osteomyelitis. Air is noted tracking into much of the cuboid. Underlying changes of Charcot joint are again suggested. Associated soft tissue air tracks about the midfoot and forefoot, and into the distal second metatarsal. Soft tissue air tracks proximally about the  distal tibia and lateral to the proximal fibula, with mild soft tissue air noted overlying the medial gastrocnemius musculature just below the level of the knee. Trace associated fluid is noted. This is suspicious for diffuse infection with a gas producing organism; necrotizing fasciitis is a concern. No well-defined joint effusion is identified, though extensive phlegmon and fluid are noted about the midfoot. Ligaments Suboptimally assessed by CT. Muscles and Tendons Not well assessed at the midfoot due to the extent of soft tissue inflammation. The visualized musculature and tendons are grossly unremarkable. Soft tissues A soft tissue laceration is noted along the plantar aspect of the foot. Extensive soft tissue swelling is noted about the midfoot and forefoot, with scattered soft tissue air as described above. Vague intramuscular edema tracks along the right lower leg, corresponding to scattered soft tissue air and fluid as described above. IMPRESSION: 1. Diffuse bony destruction involving the midfoot, with callus formation about the bases of the metatarsals and diffuse osseous erosions, compatible with osteomyelitis superimposed on changes of Charcot joint. Air tracks about the midfoot and forefoot, and into the distal second metatarsal. 2. Soft tissue air tracks proximally about the distal tibia and lateral to the proximal fibula, with mild soft tissue air overlying the medial gastrocnemius musculature just  below the level of the knee. Trace associated fluid noted. This is suspicious for diffuse infection with a gas producing organism. Necrotizing fasciitis is a concern. 3. Vague intramuscular edema tracks along the right lower leg, corresponding to scattered soft tissue air and fluid described above. 4. Soft tissue laceration along the plantar aspect of the foot. These results were called by telephone at the time of interpretation on 07/18/2018 at 12:10 am to Dr. Marily MemosJASON MESNER, who verbally acknowledged these  results. Electronically Signed   By: Roanna RaiderJeffery  Chang M.D.   On: 07/18/2018 00:11   [place] Abdomen Limited Ruq  Result Date: 07/18/2018 CLINICAL DATA:  47 year old male with abnormal LFTs. Initial encounter. EXAM: ULTRASOUND ABDOMEN LIMITED RIGHT UPPER QUADRANT COMPARISON:  05/31/2012 CT. FINDINGS: Gallbladder: Tumefactive sludge. No gallbladder wall thickening. Patient not tender over the gallbladder during scanning per sonographer. Common bile duct: Diameter: 3.4 mm proximally. Distal aspect not visualized secondary to bowel gas. Liver: Liver of increased echogenicity consistent with fatty infiltration and/or hepatocellular disease. No focal hepatic lesion. Portal vein is patent on color Doppler imaging with normal direction of blood flow towards the liver. IMPRESSION: 1. Tumefactive gallbladder sludge. No sonographic evidence of gallbladder inflammation. 2. Liver of increased echogenicity consistent with fatty infiltration and/or hepatocellular disease. No focal hepatic lesion noted. Electronically Signed   By: Lacy DuverneySteven  Olson M.D.   On: 07/18/2018 07:08   - pertinent xrays, CT, MRI studies were reviewed and independently interpreted  Positive ROS: All other systems have been reviewed and were otherwise negative with the exception of those mentioned in the HPI and as above.  Physical Exam: General: Alert, no acute distress Psychiatric: Patient is competent for consent with normal mood and affect Lymphatic: No axillary or cervical lymphadenopathy Cardiovascular: No pedal edema Respiratory: No cyanosis, no use of accessory musculature GI: No organomegaly, abdomen is soft and non-tender    Images:  @ENCIMAGES @  Labs:  Lab Results  Component Value Date   HGBA1C 7.4 (H) 07/17/2018   CRP 28.0 (H) 07/17/2018   REPTSTATUS PENDING 07/17/2018   CULT  07/17/2018    NO GROWTH < 12 HOURS Performed at [facility], [address] [place], [place] [address]    [facility]      Lab Results  Component Value Date   ALBUMIN 2.8 (L) 07/17/2018    Neurologic: Patient does not have protective sensation bilateral lower extremities.   MUSCULOSKELETAL:   Skin: Examination patient has purulent drainage from the ulcer on the rocker-bottom deformity of the right foot.  This probes all the way down to destructive necrotic bone.  Patient has good hair growth down to his ankle.  He has good capillary refill in the right foot.  Radiographs show destructive bony changes with Charcot arthropathy.  There is no ascending cellulitis.  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  Plan: Plan: We will plan for a right transtibial amputation tomorrow.  Anticipate patient should be able to be discharged to home with a wound VAC and a stump shrinker. Moderate caloric malnutrition.  Thank you for the consult and the opportunity to see Kurt Anderson  Isabellarose Kope, MD [facility] [phone] 5:44 PM

## 2018-07-18 NOTE — Progress Notes (Addendum)
Initial Nutrition Assessment  DOCUMENTATION CODES:   Obesity unspecified  INTERVENTION:    Advance diet as medically appropriate; add supplements when able  NUTRITION DIAGNOSIS:   Increased nutrient needs related to wound healing as evidenced by estimated needs  GOAL:   Patient will meet greater than or equal to 90% of their needs  MONITOR:   Diet advancement, PO intake, Labs, Skin, Weight trends, I & O's  REASON FOR ASSESSMENT:   Consult Wound healing  ASSESSMENT:   47 y.o. 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   Pt admitted with R lower extremity cellulitis, abscess and sepsis. He is currently not in his room; in VASCULAR LAB. NPO for procedure. Per malnutrition screening pt eating poorly due to a decreased appetite PTA.  Pt would benefit from addition of oral nutrition supplements (ONS). Labs and medications reviewed. Na 130 (L). CBG's 71-66-117.  NUTRITION - FOCUSED PHYSICAL EXAM:  Unable to complete at this time.  Diet Order:   Diet Order           Diet NPO time specified  Diet effective now         EDUCATION NEEDS:   Not appropriate for education at this time  Skin:  Skin Assessment: Skin Integrity Issues: Skin Integrity Issues:: Diabetic Ulcer Diabetic Ulcer: R foot  Last BM:  8/5   Intake/Output Summary (Last 24 hours) at 07/18/2018 1216 Last data filed at 07/18/2018 0900 Gross per 24 hour  Intake 3089.16 ml  Output 350 ml  Net 2739.16 ml   Height:   Ht Readings from Last 1 Encounters:  07/17/18 5\' 7"  (1.702 m)   Weight:   Wt Readings from Last 1 Encounters:  07/17/18 218 lb (98.9 kg)   BMI:  Body mass index is 34.14 kg/m.  Estimated Nutritional Needs:   Kcal:  1800-2000  Protein:  100-115 gm  Fluid:  1.8-2.0 L  Maureen ChattersKatie Deztiny Sarra, RD, LDN Pager #: [phone] After-Hours Pager #: [phone]

## 2018-07-18 NOTE — Progress Notes (Signed)
ANTIBIOTIC CONSULT NOTE-Preliminary  Pharmacy Consult for Vancomycin Indication: Wound infection  No Known Allergies  Patient Measurements: Height: 5\' 7"  (170.2 cm) Weight: 218 lb (98.9 kg) IBW/kg (Calculated) : 66.1  Vital Signs: Temp: 99.9 F (37.7 C) (08/05 2306) Temp Source: Oral (08/05 2306) BP: 105/67 (08/06 0102) Pulse Rate: 73 (08/06 0102)  Labs: Recent Labs    07/17/18 2102  WBC 16.4*  HGB 12.7*  PLT 434*  CREATININE 1.23    Estimated Creatinine Clearance: 84.1 mL/min (by C-G formula based on SCr of 1.23 mg/dL).  No results for input(s): VANCOTROUGH, VANCOPEAK, VANCORANDOM, GENTTROUGH, GENTPEAK, GENTRANDOM, TOBRATROUGH, TOBRAPEAK, TOBRARND, AMIKACINPEAK, AMIKACINTROU, AMIKACIN in the last 72 hours.   Microbiology: Recent Results (from the past 720 hour(s))  Blood Culture (routine x 2)     Status: None (Preliminary result)   Collection Time: 07/17/18  9:02 PM  Result Value Ref Range Status   Specimen Description BLOOD RIGHT ARM  Final   Special Requests   Final    BOTTLES DRAWN AEROBIC AND ANAEROBIC Blood Culture adequate volume Performed at [facility], [address] [place], [place] [address]    Culture PENDING  Incomplete   Report Status PENDING  Incomplete  Blood Culture (routine x 2)     Status: None (Preliminary result)   Collection Time: 07/17/18  9:05 PM  Result Value Ref Range Status   Specimen Description BLOOD LEFT ARM  Final   Special Requests   Final    BOTTLES DRAWN AEROBIC ONLY Blood Culture adequate volume Performed at [facility], [address] [place], [place] [address]    Culture PENDING  Incomplete   Report Status PENDING  Incomplete    Medical History: Past Medical History:  Diagnosis Date  . Type 2 diabetes mellitus (HCC)     Medications:  Vancomycin 1000 mg IV ordered by the EDP  Assessment: 47 yo diabetic make with hx of osteomyelitis of his right foot.  Pharmacy has been asked to provide vancomycin dosing.  Goal  of Therapy:  Vancomycin troughs 15-20 mcg/ml  Plan:  Preliminary review of pertinent patient information completed.  Protocol will be initiated with additional dose of vancomycin 1000 mg IV for a total loading dose of 2000 mg. .  [facility] clinical pharmacist will complete review during morning rounds to assess patient and finalize treatment regimen if needed.  Arelia SneddonMason, Selmer Adduci Anne, [facility] 07/18/2018,1:39 AM.

## 2018-07-18 NOTE — Progress Notes (Addendum)
VASCULAR LAB PRELIMINARY  ARTERIAL  ABI completed:    RIGHT    LEFT    PRESSURE WAVEFORM  PRESSURE WAVEFORM  BRACHIAL 121 Triphasic BRACHIAL 127 Triphasic  DP 140 Sharp monophasic sound biphasic DP 138 Triphasic  PT 132 Biphasic sounds Triphasic PT    Second Toe Great toe amputated 24 NA GREAT TOE 88 NA    RIGHT LEFT  AB/ TBI 1.1 / 0.19 1.3 / 0.69   ABI/TBI- Right ABI indicates normal arterial flow at rest with and abnormal TBI. Left ABI indicates normal arterial flow with calcification and an abnormal TBI  Petra Dumler, RVS 07/18/2018, 12:09 PM

## 2018-07-19 ENCOUNTER — Encounter (HOSPITAL_COMMUNITY): Payer: Self-pay | Admitting: *Deleted

## 2018-07-19 ENCOUNTER — Inpatient Hospital Stay (HOSPITAL_COMMUNITY): Payer: Self-pay | Admitting: Anesthesiology

## 2018-07-19 ENCOUNTER — Encounter (HOSPITAL_COMMUNITY)
Admission: EM | Disposition: A | Discharge status: Home / Self Care | Payer: Self-pay | Source: Home / Self Care | Attending: Internal Medicine

## 2018-07-19 ENCOUNTER — Other Ambulatory Visit: Payer: Self-pay

## 2018-07-19 DIAGNOSIS — D509 Iron deficiency anemia, unspecified: Secondary | ICD-10-CM | POA: Diagnosis present

## 2018-07-19 DIAGNOSIS — M86171 Other acute osteomyelitis, right ankle and foot: Secondary | ICD-10-CM | POA: Diagnosis not present

## 2018-07-19 DIAGNOSIS — R809 Proteinuria, unspecified: Secondary | ICD-10-CM

## 2018-07-19 DIAGNOSIS — E8809 Other disorders of plasma-protein metabolism, not elsewhere classified: Secondary | ICD-10-CM

## 2018-07-19 HISTORY — PX: AMPUTATION: SHX166

## 2018-07-19 LAB — CBC WITH DIFFERENTIAL/PLATELET
Abs Immature Granulocytes: 0.1 10*3/uL (ref 0.0–0.1)
BASOS ABS: 0.1 10*3/uL (ref 0.0–0.1)
Basophils Relative: 1 %
EOS ABS: 0.2 10*3/uL (ref 0.0–0.7)
EOS PCT: 2 %
HCT: 30.8 % — ABNORMAL LOW (ref 39.0–52.0)
HEMOGLOBIN: 9.9 g/dL — AB (ref 13.0–17.0)
Immature Granulocytes: 1 %
Lymphocytes Relative: 14 %
Lymphs Abs: 1.5 10*3/uL (ref 0.7–4.0)
MCH: 27.4 pg (ref 26.0–34.0)
MCHC: 32.1 g/dL (ref 30.0–36.0)
MCV: 85.3 fL (ref 78.0–100.0)
MONO ABS: 1 10*3/uL (ref 0.1–1.0)
Monocytes Relative: 9 %
Neutro Abs: 7.8 10*3/uL — ABNORMAL HIGH (ref 1.7–7.7)
Neutrophils Relative %: 73 %
Platelets: 366 10*3/uL (ref 150–400)
RBC: 3.61 MIL/uL — AB (ref 4.22–5.81)
RDW: 13.2 % (ref 11.5–15.5)
WBC: 10.6 10*3/uL — AB (ref 4.0–10.5)

## 2018-07-19 LAB — COMPREHENSIVE METABOLIC PANEL
ALBUMIN: 2.1 g/dL — AB (ref 3.5–5.0)
ALK PHOS: 109 U/L (ref 38–126)
ALT: 26 U/L (ref 0–44)
ANION GAP: 10 (ref 5–15)
AST: 23 U/L (ref 15–41)
BILIRUBIN TOTAL: 0.8 mg/dL (ref 0.3–1.2)
BUN: 12 mg/dL (ref 6–20)
CALCIUM: 7.6 mg/dL — AB (ref 8.9–10.3)
CO2: 23 mmol/L (ref 22–32)
Chloride: 99 mmol/L (ref 98–111)
Creatinine, Ser: 1 mg/dL (ref 0.61–1.24)
GFR calc non Af Amer: >60 mL/min (ref >60)
GLUCOSE: 213 mg/dL — AB (ref 70–99)
POTASSIUM: 4 mmol/L (ref 3.5–5.1)
SODIUM: 132 mmol/L — AB (ref 135–145)
TOTAL PROTEIN: 6 g/dL — AB (ref 6.5–8.1)

## 2018-07-19 LAB — HEPATITIS PANEL, ACUTE
HEP B C IGM: NEGATIVE
HEP B S AG: NEGATIVE
Hep A IgM: NEGATIVE

## 2018-07-19 LAB — GLUCOSE, CAPILLARY
GLUCOSE-CAPILLARY: 143 mg/dL — AB (ref 70–99)
GLUCOSE-CAPILLARY: 175 mg/dL — AB (ref 70–99)
GLUCOSE-CAPILLARY: 181 mg/dL — AB (ref 70–99)
GLUCOSE-CAPILLARY: 199 mg/dL — AB (ref 70–99)
GLUCOSE-CAPILLARY: 201 mg/dL — AB (ref 70–99)
Glucose-Capillary: 134 mg/dL — ABNORMAL HIGH (ref 70–99)
Glucose-Capillary: 215 mg/dL — ABNORMAL HIGH (ref 70–99)

## 2018-07-19 LAB — HIV ANTIBODY (ROUTINE TESTING W REFLEX): HIV SCREEN 4TH GENERATION: NONREACTIVE

## 2018-07-19 LAB — SURGICAL PCR SCREEN
MRSA, PCR: NEGATIVE
Staphylococcus aureus: POSITIVE — AB

## 2018-07-19 SURGERY — AMPUTATION BELOW KNEE
Anesthesia: Monitor Anesthesia Care | Site: Leg Lower | Laterality: Right

## 2018-07-19 MED ORDER — DOCUSATE SODIUM 100 MG PO CAPS
100.0000 mg | ORAL_CAPSULE | Freq: Two times a day (BID) | ORAL | Status: DC
  Administered 2018-07-19 – 2018-07-23 (×6): 100 mg via ORAL
  Filled 2018-07-19 (×7): qty 1

## 2018-07-19 MED ORDER — CEFAZOLIN SODIUM-DEXTROSE 2-4 GM/100ML-% IV SOLN
2.0000 g | INTRAVENOUS | Status: AC
  Administered 2018-07-19: 2 g via INTRAVENOUS
  Filled 2018-07-19 (×2): qty 100

## 2018-07-19 MED ORDER — OXYCODONE HCL 5 MG PO TABS
10.0000 mg | ORAL_TABLET | ORAL | Status: DC | PRN
  Administered 2018-07-20 (×2): 15 mg via ORAL
  Filled 2018-07-19 (×2): qty 3

## 2018-07-19 MED ORDER — ONDANSETRON HCL 4 MG/2ML IJ SOLN
INTRAMUSCULAR | Status: DC | PRN
  Administered 2018-07-19: 4 mg via INTRAVENOUS

## 2018-07-19 MED ORDER — INSULIN ASPART 100 UNIT/ML ~~LOC~~ SOLN
0.0000 [IU] | SUBCUTANEOUS | Status: DC
Start: 2018-07-19 — End: 2018-07-20
  Administered 2018-07-19: 2 [IU] via SUBCUTANEOUS
  Administered 2018-07-19 – 2018-07-20 (×3): 3 [IU] via SUBCUTANEOUS
  Administered 2018-07-20: 2 [IU] via SUBCUTANEOUS

## 2018-07-19 MED ORDER — METOCLOPRAMIDE HCL 5 MG/ML IJ SOLN
5.0000 mg | Freq: Three times a day (TID) | INTRAMUSCULAR | Status: DC | PRN
  Administered 2018-07-20 – 2018-07-21 (×2): 10 mg via INTRAVENOUS
  Filled 2018-07-19 (×2): qty 2

## 2018-07-19 MED ORDER — ONDANSETRON HCL 4 MG/2ML IJ SOLN
4.0000 mg | Freq: Four times a day (QID) | INTRAMUSCULAR | Status: DC | PRN
  Administered 2018-07-21: 4 mg via INTRAVENOUS

## 2018-07-19 MED ORDER — MEPERIDINE HCL 50 MG/ML IJ SOLN: 6.2500 mg | INTRAMUSCULAR | Status: DC | PRN

## 2018-07-19 MED ORDER — ACETAMINOPHEN 325 MG PO TABS
325.0000 mg | ORAL_TABLET | Freq: Four times a day (QID) | ORAL | Status: DC | PRN
  Administered 2018-07-20 – 2018-07-22 (×2): 650 mg via ORAL
  Filled 2018-07-19 (×2): qty 2

## 2018-07-19 MED ORDER — ONDANSETRON HCL 4 MG PO TABS: 4.0000 mg | ORAL_TABLET | Freq: Four times a day (QID) | ORAL | Status: DC | PRN

## 2018-07-19 MED ORDER — CHLORHEXIDINE GLUCONATE 4 % EX LIQD
60.0000 mL | Freq: Once | CUTANEOUS | Status: AC
  Administered 2018-07-19: 4 via TOPICAL

## 2018-07-19 MED ORDER — ONDANSETRON HCL 4 MG/2ML IJ SOLN
INTRAMUSCULAR | Status: AC
  Filled 2018-07-19: qty 2

## 2018-07-19 MED ORDER — METHOCARBAMOL 500 MG PO TABS
500.0000 mg | ORAL_TABLET | Freq: Four times a day (QID) | ORAL | Status: DC | PRN
  Administered 2018-07-20 – 2018-07-22 (×3): 500 mg via ORAL
  Filled 2018-07-19 (×3): qty 1

## 2018-07-19 MED ORDER — OXYCODONE HCL 5 MG PO TABS
5.0000 mg | ORAL_TABLET | ORAL | Status: DC | PRN
Start: 2018-07-19 — End: 2018-07-21
  Administered 2018-07-21: 10 mg via ORAL
  Filled 2018-07-19: qty 2

## 2018-07-19 MED ORDER — ONDANSETRON HCL 4 MG/2ML IJ SOLN: 4.0000 mg | Freq: Once | INTRAMUSCULAR | Status: DC | PRN

## 2018-07-19 MED ORDER — SODIUM CHLORIDE 0.9 % IV SOLN: INTRAVENOUS | Status: DC

## 2018-07-19 MED ORDER — POLYETHYLENE GLYCOL 3350 17 G PO PACK: 17.0000 g | PACK | Freq: Every day | ORAL | Status: DC | PRN

## 2018-07-19 MED ORDER — ACETAMINOPHEN 10 MG/ML IV SOLN: 1000.0000 mg | Freq: Once | INTRAVENOUS | Status: DC | PRN

## 2018-07-19 MED ORDER — HYDROCODONE-ACETAMINOPHEN 7.5-325 MG PO TABS: 1.0000 | ORAL_TABLET | Freq: Once | ORAL | Status: DC | PRN

## 2018-07-19 MED ORDER — ROPIVACAINE HCL 5 MG/ML IJ SOLN
INTRAMUSCULAR | Status: DC | PRN
  Administered 2018-07-19: 30 mL via PERINEURAL
  Administered 2018-07-19: 12 mL via PERINEURAL

## 2018-07-19 MED ORDER — 0.9 % SODIUM CHLORIDE (POUR BTL) OPTIME
TOPICAL | Status: DC | PRN
  Administered 2018-07-19: 1000 mL

## 2018-07-19 MED ORDER — FENTANYL CITRATE (PF) 100 MCG/2ML IJ SOLN
INTRAMUSCULAR | Status: DC | PRN
  Administered 2018-07-19: 50 ug via INTRAVENOUS

## 2018-07-19 MED ORDER — FENTANYL CITRATE (PF) 100 MCG/2ML IJ SOLN
100.0000 ug | Freq: Once | INTRAMUSCULAR | Status: AC
  Administered 2018-07-19: 100 ug via INTRAVENOUS

## 2018-07-19 MED ORDER — PROPOFOL 10 MG/ML IV BOLUS
INTRAVENOUS | Status: DC | PRN
  Administered 2018-07-19: 40 mg via INTRAVENOUS

## 2018-07-19 MED ORDER — METOCLOPRAMIDE HCL 5 MG PO TABS: 5.0000 mg | ORAL_TABLET | Freq: Three times a day (TID) | ORAL | Status: DC | PRN

## 2018-07-19 MED ORDER — PROPOFOL 10 MG/ML IV BOLUS
INTRAVENOUS | Status: AC
  Filled 2018-07-19: qty 20

## 2018-07-19 MED ORDER — CHLORHEXIDINE GLUCONATE CLOTH 2 % EX PADS
6.0000 | MEDICATED_PAD | Freq: Every day | CUTANEOUS | Status: DC
  Administered 2018-07-19: 6 via TOPICAL

## 2018-07-19 MED ORDER — FENTANYL CITRATE (PF) 100 MCG/2ML IJ SOLN
INTRAMUSCULAR | Status: AC
Start: 2018-07-19 — End: 2018-07-19
  Administered 2018-07-19: 100 ug via INTRAVENOUS
  Filled 2018-07-19: qty 2

## 2018-07-19 MED ORDER — MAGNESIUM CITRATE PO SOLN: 1.0000 | Freq: Once | ORAL | Status: DC | PRN

## 2018-07-19 MED ORDER — MIDAZOLAM HCL 2 MG/2ML IJ SOLN
2.0000 mg | Freq: Once | INTRAMUSCULAR | Status: AC
  Administered 2018-07-19: 2 mg via INTRAVENOUS

## 2018-07-19 MED ORDER — MUPIROCIN 2 % EX OINT
1.0000 "application " | TOPICAL_OINTMENT | Freq: Two times a day (BID) | CUTANEOUS | Status: DC
  Administered 2018-07-19 – 2018-07-22 (×6): 1 via NASAL
  Filled 2018-07-19 (×3): qty 22

## 2018-07-19 MED ORDER — MIDAZOLAM HCL 2 MG/2ML IJ SOLN
INTRAMUSCULAR | Status: AC
  Administered 2018-07-19: 2 mg via INTRAVENOUS
  Filled 2018-07-19: qty 2

## 2018-07-19 MED ORDER — POVIDONE-IODINE 10 % EX SWAB: 2.0000 "application " | Freq: Once | CUTANEOUS | Status: DC

## 2018-07-19 MED ORDER — PROPOFOL 500 MG/50ML IV EMUL
INTRAVENOUS | Status: DC | PRN
  Administered 2018-07-19: 100 ug/kg/min via INTRAVENOUS

## 2018-07-19 MED ORDER — HYDROMORPHONE HCL 1 MG/ML IJ SOLN
0.5000 mg | INTRAMUSCULAR | Status: DC | PRN
  Administered 2018-07-21: 1 mg via INTRAVENOUS
  Filled 2018-07-19: qty 1

## 2018-07-19 MED ORDER — BISACODYL 10 MG RE SUPP
10.0000 mg | Freq: Every day | RECTAL | Status: DC | PRN
  Administered 2018-07-21: 10 mg via RECTAL
  Filled 2018-07-19: qty 1

## 2018-07-19 MED ORDER — FENTANYL CITRATE (PF) 250 MCG/5ML IJ SOLN
INTRAMUSCULAR | Status: AC
  Filled 2018-07-19: qty 5

## 2018-07-19 MED ORDER — METHOCARBAMOL 1000 MG/10ML IJ SOLN
500.0000 mg | Freq: Four times a day (QID) | INTRAVENOUS | Status: DC | PRN
  Filled 2018-07-19: qty 5

## 2018-07-19 MED ORDER — FENTANYL CITRATE (PF) 100 MCG/2ML IJ SOLN: 25.0000 ug | INTRAMUSCULAR | Status: DC | PRN

## 2018-07-19 MED ORDER — LACTATED RINGERS IV SOLN
INTRAVENOUS | Status: DC
  Administered 2018-07-19: 15:00:00 via INTRAVENOUS

## 2018-07-19 SURGICAL SUPPLY — 38 items
BENZOIN TINCTURE PRP APPL 2/3 (GAUZE/BANDAGES/DRESSINGS) ×12 IMPLANT
BLADE SAW RECIP 87.9 MT (BLADE) ×3 IMPLANT
BLADE SURG 21 STRL SS (BLADE) ×3 IMPLANT
BNDG COHESIVE 6X5 TAN STRL LF (GAUZE/BANDAGES/DRESSINGS) ×6 IMPLANT
BNDG GAUZE ELAST 4 BULKY (GAUZE/BANDAGES/DRESSINGS) ×6 IMPLANT
CANISTER WOUND CARE 500ML ATS (WOUND CARE) ×2 IMPLANT
COVER SURGICAL LIGHT HANDLE (MISCELLANEOUS) ×3 IMPLANT
CUFF TOURNIQUET SINGLE 34IN LL (TOURNIQUET CUFF) IMPLANT
CUFF TOURNIQUET SINGLE 44IN (TOURNIQUET CUFF) IMPLANT
DRAPE INCISE IOBAN 66X45 STRL (DRAPES) IMPLANT
DRAPE U-SHAPE 47X51 STRL (DRAPES) ×3 IMPLANT
DRESSING PREVENA PLUS CUSTOM (GAUZE/BANDAGES/DRESSINGS) ×1 IMPLANT
DRSG PREVENA PLUS CUSTOM (GAUZE/BANDAGES/DRESSINGS) ×3
DURAPREP 26ML APPLICATOR (WOUND CARE) ×3 IMPLANT
ELECT REM PT RETURN 9FT ADLT (ELECTROSURGICAL) ×3
ELECTRODE REM PT RTRN 9FT ADLT (ELECTROSURGICAL) ×1 IMPLANT
GLOVE BIOGEL PI IND STRL 9 (GLOVE) ×1 IMPLANT
GLOVE BIOGEL PI INDICATOR 9 (GLOVE) ×2
GLOVE SURG ORTHO 9.0 STRL STRW (GLOVE) ×3 IMPLANT
GOWN STRL REUS W/ TWL XL LVL3 (GOWN DISPOSABLE) ×2 IMPLANT
GOWN STRL REUS W/TWL XL LVL3 (GOWN DISPOSABLE) ×4
KIT BASIN OR (CUSTOM PROCEDURE TRAY) ×3 IMPLANT
KIT TURNOVER KIT B (KITS) ×3 IMPLANT
MANIFOLD NEPTUNE II (INSTRUMENTS) ×3 IMPLANT
NS IRRIG 1000ML POUR BTL (IV SOLUTION) ×3 IMPLANT
PACK ORTHO EXTREMITY (CUSTOM PROCEDURE TRAY) ×3 IMPLANT
PAD ARMBOARD 7.5X6 YLW CONV (MISCELLANEOUS) ×3 IMPLANT
PREVENA RESTOR ARTHOFORM 46X30 (CANNISTER) ×2 IMPLANT
SPONGE LAP 18X18 X RAY DECT (DISPOSABLE) IMPLANT
STAPLER VISISTAT 35W (STAPLE) IMPLANT
STOCKINETTE IMPERVIOUS LG (DRAPES) ×3 IMPLANT
SUT SILK 2 0 (SUTURE) ×2
SUT SILK 2-0 18XBRD TIE 12 (SUTURE) ×1 IMPLANT
SUT VIC AB 1 CTX 27 (SUTURE) IMPLANT
TOWEL OR 17X26 10 PK STRL BLUE (TOWEL DISPOSABLE) ×3 IMPLANT
TUBE CONNECTING 12'X1/4 (SUCTIONS) ×1
TUBE CONNECTING 12X1/4 (SUCTIONS) ×2 IMPLANT
YANKAUER SUCT BULB TIP NO VENT (SUCTIONS) ×3 IMPLANT

## 2018-07-19 NOTE — Anesthesia Procedure Notes (Signed)
Procedure Name: MAC Date/Time: 07/19/2018 3:35 PM Performed by: Barrington Ellison, CRNA Pre-anesthesia Checklist: Patient identified, Emergency Drugs available, Suction available, Patient being monitored and Timeout performed Patient Re-evaluated:Patient Re-evaluated prior to induction Oxygen Delivery Method: Simple face mask

## 2018-07-19 NOTE — Transfer of Care (Signed)
Immediate Anesthesia Transfer of Care Note  Patient: Kurt Anderson  Procedure(s) Performed: AMPUTATION BELOW KNEE (Right Leg Lower)  Patient Location: PACU  Anesthesia Type:MAC and Regional  Level of Consciousness: awake, alert  and oriented  Airway & Oxygen Therapy: Patient Spontanous Breathing  Post-op Assessment: Report given to RN  Post vital signs: Reviewed and stable  Last Vitals:  Vitals Value Taken Time  BP    Temp    Pulse 67 07/19/2018  4:15 PM  Resp    SpO2 98 % 07/19/2018  4:15 PM  Vitals shown include unvalidated device data.  Last Pain:  Vitals:   07/19/18 1520  TempSrc:   PainSc: 0-No pain         Complications: No apparent anesthesia complications

## 2018-07-19 NOTE — Anesthesia Procedure Notes (Addendum)
Anesthesia Regional Block: Adductor canal block   Pre-Anesthetic Checklist: ,, timeout performed, Correct Patient, Correct Site, Correct Laterality, Correct Procedure, Correct Position, site marked, Risks and benefits discussed,  Surgical consent,  Pre-op evaluation,  At surgeon's request and post-op pain management  Laterality: Right  Prep: Maximum Sterile Barrier Precautions used, chloraprep       Needles:  Injection technique: Single-shot  Needle Type: Echogenic Needle     Needle Length: 9cm  Needle Gauge: 21     Additional Needles:   Procedures:,,,, ultrasound used (permanent image in chart),,,,  Narrative:  Start time: 07/19/2018 3:17 PM End time: 07/19/2018 3:23 PM Injection made incrementally with aspirations every 5 mL.  Performed by: Personally  Anesthesiologist: Trevor IhaHouser, Vella Colquitt A, MD  Additional Notes: Pt toerated procedure well

## 2018-07-19 NOTE — Anesthesia Procedure Notes (Signed)
Anesthesia Regional Block: Popliteal block   Pre-Anesthetic Checklist: ,, timeout performed, Correct Patient, Correct Site, Correct Laterality, Correct Procedure, Correct Position, site marked, Risks and benefits discussed, pre-op evaluation,  At surgeon's request and post-op pain management  Laterality: Right  Prep: Maximum Sterile Barrier Precautions used, chloraprep       Needles:  Injection technique: Single-shot  Needle Type: Echogenic Needle     Needle Length: 9cm  Needle Gauge: 21     Additional Needles:   Procedures:,,,, ultrasound used (permanent image in chart),,,,  Narrative:  Start time: 07/19/2018 3:06 PM End time: 07/19/2018 3:16 PM Injection made incrementally with aspirations every 5 mL. Anesthesiologist: Trevor IhaHouser, Stephen A, MD

## 2018-07-19 NOTE — Interval H&P Note (Signed)
History and Physical Interval Note:  07/19/2018 6:35 AM  Kurt Anderson  has presented today for surgery, with the diagnosis of Osteomyelitis Right foot  The various methods of treatment have been discussed with the patient and family. After consideration of risks, benefits and other options for treatment, the patient has consented to  Procedure(s): AMPUTATION BELOW KNEE (Right) as a surgical intervention .  The patient's history has been reviewed, patient examined, no change in status, stable for surgery.  I have reviewed the patient's chart and labs.  Questions were answered to the patient's satisfaction.     Nadara MustardMarcus V Duda

## 2018-07-19 NOTE — Anesthesia Preprocedure Evaluation (Addendum)
Anesthesia Evaluation  Patient identified by MRN, date of birth, ID band Patient awake    Reviewed: Allergy & Precautions, NPO status , Patient's Chart, lab work & pertinent test results  History of Anesthesia Complications (+) PONV  Airway Mallampati: II  TM Distance: >3 FB Neck ROM: Full    Dental no notable dental hx. (+) Teeth Intact, Dental Advisory Given   Pulmonary neg pulmonary ROS,    Pulmonary exam normal breath sounds clear to auscultation       Cardiovascular Exercise Tolerance: Good negative cardio ROS Normal cardiovascular exam Rhythm:Regular Rate:Normal     Neuro/Psych negative neurological ROS  negative psych ROS   GI/Hepatic negative GI ROS, Neg liver ROS,   Endo/Other  diabetes, Type 2  Renal/GU negative Renal ROS     Musculoskeletal   Abdominal (+) + obese,   Peds negative pediatric ROS (+)  Hematology  (+) anemia ,   Anesthesia Other Findings Spanish speaking patient.Osteomyelyetis R foot  Reproductive/Obstetrics                            Anesthesia Physical Anesthesia Plan  ASA: III  Anesthesia Plan: MAC and Regional   Post-op Pain Management:    Induction:   PONV Risk Score and Plan:   Airway Management Planned: Mask, Natural Airway and Nasal Cannula  Additional Equipment:   Intra-op Plan:   Post-operative Plan:   Informed Consent: I have reviewed the patients History and Physical, chart, labs and discussed the procedure including the risks, benefits and alternatives for the proposed anesthesia with the patient or authorized representative who has indicated his/her understanding and acceptance.     Plan Discussed with: CRNA  Anesthesia Plan Comments:          Anesthesia Quick Evaluation

## 2018-07-19 NOTE — Op Note (Signed)
   Date of Surgery: 07/19/2018  INDICATIONS: Mr. Kurt Anderson is a 47 y.o.-year-old male who has osteomyelitis right foot.  PREOPERATIVE DIAGNOSIS: osteomyelitis right foot  POSTOPERATIVE DIAGNOSIS: Same.  PROCEDURE: Transtibial amputation Application of Prevena wound VAC  SURGEON: Lajoyce Cornersuda, M.D.  ANESTHESIA:  general  IV FLUIDS AND URINE: See anesthesia.  ESTIMATED BLOOD LOSS: min mL.  COMPLICATIONS: None.  DESCRIPTION OF PROCEDURE: The patient was brought to the operating room and underwent a general anesthetic. After adequate levels of anesthesia were obtained patient's lower extremity was prepped using DuraPrep draped into a sterile field. A timeout was called. The foot was draped out of the sterile field with impervious stockinette. A transverse incision was made 11 cm distal to the tibial tubercle. This curved proximally and a large posterior flap was created. The tibia was transected 1 cm proximal to the skin incision. The fibula was transected just proximal to the tibial incision. The tibia was beveled anteriorly. A large posterior flap was created. The sciatic nerve was pulled cut and allowed to retract. The vascular bundles were suture ligated with 2-0 silk. The deep and superficial fascial layers were closed using #1 Vicryl. The skin was closed using staples and 2-0 nylon. The wound was covered with a Prevena wound VAC. There was a good suction fit. A prosthetic shrinker was applied. Patient was extubated taken to the PACU in stable condition.   DISCHARGE PLANNING:  Antibiotic duration:24 hours post op  Weightbearing: NWB right  Pain medication: opoid pathway  Dressing care/ Wound ZOX:WRUEAVVAC:Restor VAC for 2 weeks  Discharge to: SNF  Follow-up: In the office 1 week post operative.  Kurt BakerMarcus [name], MD [facility] 4:10 PM

## 2018-07-19 NOTE — Anesthesia Postprocedure Evaluation (Signed)
Anesthesia Post Note  Patient: Kurt Anderson  Procedure(s) Performed: AMPUTATION BELOW KNEE (Right Leg Lower)     Patient location during evaluation: PACU Anesthesia Type: Regional Level of consciousness: awake and alert Pain management: pain level controlled Vital Signs Assessment: post-procedure vital signs reviewed and stable Respiratory status: spontaneous breathing, nonlabored ventilation, respiratory function stable and patient connected to nasal cannula oxygen Cardiovascular status: stable and blood pressure returned to baseline Postop Assessment: no apparent nausea or vomiting Anesthetic complications: no    Last Vitals:  Vitals:   07/19/18 1700 07/19/18 1734  BP: 114/68 118/74  Pulse: 61 63  Resp: 13 18  Temp:  36.9 C  SpO2: 99% 98%    Last Pain:  Vitals:   07/19/18 1734  TempSrc: Oral  PainSc:                  Trevor IhaStephen A Houser

## 2018-07-19 NOTE — Progress Notes (Signed)
PROGRESS NOTE    Kurt Anderson  WUJ:[id number] DOB: [birth year] DOA: 07/17/2018 PCP: Ardyth Man, MD    Brief Narrative:  47 year old gentleman history of type 2 diabetes, history of Charcot joint status post right big toe amputation 2018, who presented to the ED with right foot abscess, osteomyelitis with purulent drainage from Charcot foot on the right with diabetic insensate and neuropathy.  Patient placed empirically on IV antibiotics.  Orthopedics consulted and patient scheduled for trans-metatarsal amputation 07/19/2018.  Assessment & Plan:   Principal Problem:   Acute osteomyelitis of right foot (HCC) Active Problems:   Type 2 diabetes mellitus (HCC)   Anemia   Abnormal LFTs   Hyponatremia   Hypoalbuminemia   Proteinuria   Iron deficiency anemia  1 right foot abscess/osteomyelitis with ulceration and purulent drainage Patient had presented with right foot abscess with purulent drainage imaging consistent with osteomyelitis.  Blood cultures obtained and are pending.  Continue empiric IV vancomycin, IV Flagyl, IV Rocephin.  Patient seen in consultation by orthopedics and patient for transmetatarsal amputation scheduled today 07/19/2018.  Per orthopedics.  Follow.  2.  Type 2 diabetes mellitus Hemoglobin A1c 7.4.  Check CBGs every 4 hours.  Placed on sliding scale insulin.  Patient will need tight diabetes control secondary to problem #1.  Follow.  3.  Iron deficiency anemia Patient with a hemoglobin of 9.9 this morning from 12.7 on admission.  Likely dilutional.  No overt bleeding.  Will likely need oral iron supplementation on discharge.  Follow H&H.  4.  Hyponatremia Likely secondary to hypovolemic hyponatremia.  Improving with hydration.  Follow.  5.  Abnormal liver function test Improving.  Acute hepatitis panel negative.  HIV nonreactive.  Follow.  6.  Hypoalbuminemia Likely multifactorial.  7.  Proteinuria Outpatient follow-up with PCP.   DVT prophylaxis:  SCDs.  Postop per orthopedics. Code Status: Full Family Communication: Updated patient.  No family at bedside. Disposition Plan: To be determined.   Consultants:   Orthopedics: Dr. Lajoyce Corners 07/18/2018  Orthopedics: Dr. Eulah Pont 07/18/2018  Wound care Cammie Mcgee, RN 07/18/2018  Procedures:   ABI with TBI 07/18/2018  Plain films of the right foot 07/17/2018  CT right lower extremity 07/17/2018  Right upper quadrant ultrasound 07/18/2018  Antimicrobials:   IV Rocephin 07/18/2018  IV Flagyl 07/18/2018  IV vancomycin 07/17/2018   Subjective: Denies any chest pain.  No shortness of breath.  No abdominal pain.  No nausea or vomiting.  Objective: Vitals:   07/19/18 1515 07/19/18 1520 07/19/18 1525 07/19/18 1615  BP:   115/65 101/64  Pulse: 72 72 72 67  Resp: 14 17 16 16   Temp:    (!) 97.5 F (36.4 C)  TempSrc:      SpO2: 98% 99% 100% 98%  Weight:      Height:        Intake/Output Summary (Last 24 hours) at 07/19/2018 1638 Last data filed at 07/19/2018 1609 Gross per 24 hour  Intake 5312.43 ml  Output 100 ml  Net 5212.43 ml   Filed Weights   07/17/18 2038 07/17/18 2337 07/19/18 1436  Weight: 98.9 kg (218 lb) 98.9 kg (218 lb) 98.9 kg (218 lb)    Examination:  General exam: Appears calm and comfortable  Respiratory system: Clear to auscultation. Respiratory effort normal. Cardiovascular system: S1 & S2 heard, RRR. No JVD, murmurs, rubs, gallops or clicks. No pedal edema. Gastrointestinal system: Abdomen is nondistended, soft and nontender. No organomegaly or masses felt. Normal bowel sounds heard. Central nervous  system: Alert and oriented. No focal neurological deficits. Extremities: Right foot with purulent drainage noted from ulcer on the bottom of deformity of the right foot, Charcot arthropathy.  Skin: No rashes, lesions or ulcers Psychiatry: Judgement and insight appear normal. Mood & affect appropriate.     Data Reviewed: I have personally reviewed following labs and imaging  studies  CBC: Recent Labs  Lab 07/17/18 2102 07/19/18 0421  WBC 16.4* 10.6*  NEUTROABS 12.9* 7.8*  HGB 12.7* 9.9*  HCT 37.1* 30.8*  MCV 83.9 85.3  PLT 434* 366   Basic Metabolic Panel: Recent Labs  Lab 07/17/18 2102 07/19/18 0421  NA 130* 132*  K 4.3 4.0  CL 94* 99  CO2 26 23  GLUCOSE 162* 213*  BUN 22* 12  CREATININE 1.23 1.00  CALCIUM 8.4* 7.6*   GFR: Estimated Creatinine Clearance: 103.4 mL/min (by C-G formula based on SCr of 1 mg/dL). Liver Function Tests: Recent Labs  Lab 07/17/18 2102 07/19/18 0421  AST 53* 23  ALT 49* 26  ALKPHOS 151* 109  BILITOT 1.5* 0.8  PROT 8.1 6.0*  ALBUMIN 2.8* 2.1*   No results for input(s): LIPASE, AMYLASE in the last 168 hours. No results for input(s): AMMONIA in the last 168 hours. Coagulation Profile: No results for input(s): INR, PROTIME in the last 168 hours. Cardiac Enzymes: No results for input(s): CKTOTAL, CKMB, CKMBINDEX, TROPONINI in the last 168 hours. BNP (last 3 results) No results for input(s): PROBNP in the last 8760 hours. HbA1C: Recent Labs    07/17/18 2102  HGBA1C 7.4*   CBG: Recent Labs  Lab 07/19/18 0150 07/19/18 0625 07/19/18 1136 07/19/18 1419 07/19/18 1615  GLUCAP 201* 199* 175* 143* 134*   Lipid Profile: No results for input(s): CHOL, HDL, LDLCALC, TRIG, CHOLHDL, LDLDIRECT in the last 72 hours. Thyroid Function Tests: No results for input(s): TSH, T4TOTAL, FREET4, T3FREE, THYROIDAB in the last 72 hours. Anemia Panel: Recent Labs    07/18/18 0746  VITAMINB12 720  FOLATE 8.4  FERRITIN 1,349*  TIBC 130*  IRON 17*  RETICCTPCT 1.0   Sepsis Labs: Recent Labs  Lab 07/17/18 2112 07/17/18 2309  LATICACIDVEN 1.45 1.14    Recent Results (from the past 240 hour(s))  Blood Culture (routine x 2)     Status: None (Preliminary result)   Collection Time: 07/17/18  9:02 PM  Result Value Ref Range Status   Specimen Description BLOOD RIGHT ARM  Final   Special Requests   Final     BOTTLES DRAWN AEROBIC AND ANAEROBIC Blood Culture adequate volume   Culture   Final    NO GROWTH 2 DAYS Performed at [facility], [address] [place], [place] [address]    Report Status PENDING  Incomplete  Blood Culture (routine x 2)     Status: None (Preliminary result)   Collection Time: 07/17/18  9:05 PM  Result Value Ref Range Status   Specimen Description BLOOD LEFT ARM  Final   Special Requests   Final    BOTTLES DRAWN AEROBIC ONLY Blood Culture adequate volume   Culture   Final    NO GROWTH 2 DAYS Performed at [facility], [address] [place], [place] [address]    Report Status PENDING  Incomplete  Surgical pcr screen     Status: Abnormal   Collection Time: 07/18/18  6:27 PM  Result Value Ref Range Status   MRSA, PCR NEGATIVE NEGATIVE Final   Staphylococcus aureus POSITIVE (A) NEGATIVE Final    Comment: (  NOTE) The Xpert SA Assay (FDA approved for NASAL specimens in patients 60 years of age and older), is one component of a comprehensive surveillance program. It is not intended to diagnose infection nor to guide or monitor treatment. Performed at [facility], [address] [place], [place] [address]          Radiology Studies: Dg Foot Complete Right  Result Date: 07/17/2018 CLINICAL DATA:  Infected blister along the plantar aspect of the right foot. History of diabetes. Code sepsis. EXAM: RIGHT FOOT COMPLETE - 3+ VIEW COMPARISON:  None. FINDINGS: Soft tissues: There is marked soft tissue swelling of the included ankle and more so of the midfoot where there are scattered areas of subcutaneous soft tissue emphysema noted along the dorsum, medial and lateral aspect of the mid and forefoot. Soft tissue ulceration is seen along the plantar surface of the midfoot measuring approximately 2 cm in length and 6 mm deep. Forefoot: There has been amputation of the first ray at the base of the first metatarsal. The second through fourth distal and middle phalanges are  intact. Osteoarthritic and erosive change of the second metatarsal head at the second MTP joint is identified with bony spurring at the base of the second proximal phalanx. The third through fifth MTP joints are intact. Midfoot: Bony destruction and disorganization of the midfoot is noted more markedly affecting the middle and lateral cuneiform with subchondral erosive change at the base of the medial cuneiform, tarsal navicular and cuboid. Laterally subluxed appearance of the third through fifth rays with widening between the second and third as well as first and second metatarsals is noted. 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 Hindfoot: Prominent plantar calcaneal enthesophyte. Slight cortical irregularity along the medial talar dome may be secondary to osteoarthritis though the possibility of osteochondritis dissecans or other transchondral injury of the talar dome is not entirely excluded. IMPRESSION: 1. Marked soft tissue swelling of the included ankle and more so of the midfoot with associated plantar soft tissue ulceration measuring 2 cm in length by 0.6 cm in depth at the level of the midfoot. 2. 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 However, superimposed on neuropathy is what appear be areas of irregular bone destruction specially involving the base of the second through fifth metatarsals raising concern for osteomyelitis. 3. Soft tissue emphysema is noted of the foot and the possibility of necrotizing fasciitis is also raised. 4. Talar dome irregularity along its medial aspect is suggested on the oblique  view. Osteochondral defect might have this appearance or change secondary to advanced osteoarthritis across the tibiotalar joint. Electronically Signed   By: Tollie Eth M.D.   On: 07/17/2018 21:54   Ct Extremity Lower Right W Contrast  Result Date: 07/18/2018 CLINICAL DATA:  Acute onset of right plantar foot wound, with swelling and drainage. Abnormal radiographs. Further evaluation requested. EXAM: CT OF THE LOWER RIGHT EXTREMITY WITH CONTRAST TECHNIQUE: Multidetector CT imaging of the lower right extremity was performed according to the standard protocol following intravenous contrast administration. COMPARISON:  Right foot radiographs performed earlier today at 9:30 p.m. CONTRAST:  ISOVUE-300 IOPAMIDOL (ISOVUE-300) INJECTION 61% FINDINGS: Bones/Joint/Cartilage  There is diffuse bony destruction involving the midfoot, with callus formation about the bases of the metatarsals and diffuse osseous erosions, compatible with osteomyelitis. Air is noted tracking into much of the cuboid. Underlying changes of Charcot joint are again suggested. Associated soft tissue air tracks about the midfoot and forefoot, and into the distal second metatarsal. Soft tissue air tracks proximally about the distal tibia and lateral to the proximal fibula, with mild soft tissue air noted overlying the medial gastrocnemius musculature just below the level of the knee. Trace associated fluid is noted. This is suspicious for diffuse infection with a gas producing organism; necrotizing fasciitis is a concern. No well-defined joint effusion is identified, though extensive phlegmon and fluid are noted about the midfoot. Ligaments Suboptimally assessed by CT. Muscles and Tendons Not well assessed at the midfoot due to the extent of soft tissue inflammation. The visualized musculature and tendons are grossly unremarkable. Soft tissues A soft tissue laceration is noted along the plantar aspect of the foot. Extensive soft tissue swelling is  noted about the midfoot and forefoot, with scattered soft tissue air as described above. Vague intramuscular edema tracks along the right lower leg, corresponding to scattered soft tissue air and fluid as described above. IMPRESSION: 1. Diffuse bony destruction involving the midfoot, with callus formation about the bases of the metatarsals and diffuse osseous erosions, compatible with osteomyelitis superimposed on changes of Charcot joint. Air tracks about the midfoot and forefoot, and into the distal second metatarsal. 2. Soft tissue air tracks proximally about the distal tibia and lateral to the proximal fibula, with mild soft tissue air overlying the medial gastrocnemius musculature just below the level of the knee. Trace associated fluid noted. This is suspicious for diffuse infection with a gas producing organism. Necrotizing fasciitis is a concern. 3. Vague intramuscular edema tracks along the right lower leg, corresponding to scattered soft tissue air and fluid described above. 4. Soft tissue laceration along the plantar aspect of the foot. These results were called by telephone at the time of interpretation on 07/18/2018 at 12:10 am to Dr. Marily MemosJASON MESNER, who verbally acknowledged these results. Electronically Signed   By: Roanna RaiderJeffery  Chang M.D.   On: 07/18/2018 00:11   [place] Abdomen Limited Ruq  Result Date: 07/18/2018 CLINICAL DATA:  47 year old male with abnormal LFTs. Initial encounter. EXAM: ULTRASOUND ABDOMEN LIMITED RIGHT UPPER QUADRANT COMPARISON:  05/31/2012 CT. FINDINGS: Gallbladder: Tumefactive sludge. No gallbladder wall thickening. Patient not tender over the gallbladder during scanning per sonographer. Common bile duct: Diameter: 3.4 mm proximally. Distal aspect not visualized secondary to bowel gas. Liver: Liver of increased echogenicity consistent with fatty infiltration and/or hepatocellular disease. No focal hepatic lesion. Portal vein is patent on color Doppler imaging with normal direction of  blood flow towards the liver. IMPRESSION: 1. Tumefactive gallbladder sludge. No sonographic evidence of gallbladder inflammation. 2. Liver of increased echogenicity consistent with fatty infiltration and/or hepatocellular disease. No focal hepatic lesion noted. Electronically Signed   By: Lacy DuverneySteven  Olson M.D.   On: 07/18/2018 07:08        Scheduled Meds: . [MAR Hold] Chlorhexidine Gluconate Cloth  6 each Topical Daily  . [MAR Hold] heparin  5,000 Units Subcutaneous Q8H  . [MAR Hold] insulin aspart  0-9 Units Subcutaneous Q4H  . [MAR Hold] mupirocin ointment  1 application Nasal BID  . povidone-iodine  2 application Topical Once   Continuous Infusions: . sodium chloride Stopped (07/19/18 1417)  . acetaminophen    . [MAR Hold] cefTRIAXone (ROCEPHIN)  IV Stopped (07/19/18 0110)  . lactated ringers 10 mL/hr at 07/19/18 1437  . [MAR Hold] metronidazole Stopped (07/19/18 1053)  . [MAR Hold] vancomycin Stopped (07/19/18 1305)     LOS: 1 day    Time spent: 35 minutes    Ramiro Harvest, MD [facility] Pager [phone]  If 7PM-7AM, please contact night-coverage [email or web address] Password [facility] 07/19/2018, 4:38 PM

## 2018-07-20 ENCOUNTER — Encounter (HOSPITAL_COMMUNITY): Payer: Self-pay | Admitting: Orthopedic Surgery

## 2018-07-20 LAB — GLUCOSE, CAPILLARY
GLUCOSE-CAPILLARY: 168 mg/dL — AB (ref 70–99)
Glucose-Capillary: 201 mg/dL — ABNORMAL HIGH (ref 70–99)
Glucose-Capillary: 207 mg/dL — ABNORMAL HIGH (ref 70–99)
Glucose-Capillary: 155 mg/dL — ABNORMAL HIGH (ref 70–99)

## 2018-07-20 LAB — CBC WITH DIFFERENTIAL/PLATELET
ABS IMMATURE GRANULOCYTES: 0.1 10*3/uL (ref 0.0–0.1)
BASOS ABS: 0.1 10*3/uL (ref 0.0–0.1)
BASOS PCT: 1 %
EOS PCT: 3 %
Eosinophils Absolute: 0.3 10*3/uL (ref 0.0–0.7)
HCT: 29.5 % — ABNORMAL LOW (ref 39.0–52.0)
HEMOGLOBIN: 9.5 g/dL — AB (ref 13.0–17.0)
Immature Granulocytes: 1 %
LYMPHS PCT: 15 %
Lymphs Abs: 1.3 10*3/uL (ref 0.7–4.0)
MCH: 27.4 pg (ref 26.0–34.0)
MCHC: 32.2 g/dL (ref 30.0–36.0)
MCV: 85 fL (ref 78.0–100.0)
MONO ABS: 0.7 10*3/uL (ref 0.1–1.0)
MONOS PCT: 8 %
Neutro Abs: 6.4 10*3/uL (ref 1.7–7.7)
Neutrophils Relative %: 72 %
Platelets: 412 10*3/uL — ABNORMAL HIGH (ref 150–400)
RBC: 3.47 MIL/uL — ABNORMAL LOW (ref 4.22–5.81)
RDW: 13.3 % (ref 11.5–15.5)
WBC: 8.8 10*3/uL (ref 4.0–10.5)

## 2018-07-20 LAB — COMPREHENSIVE METABOLIC PANEL
ALK PHOS: 101 U/L (ref 38–126)
ALT: 24 U/L (ref 0–44)
ANION GAP: 9 (ref 5–15)
AST: 27 U/L (ref 15–41)
Albumin: 2 g/dL — ABNORMAL LOW (ref 3.5–5.0)
BUN: 9 mg/dL (ref 6–20)
CALCIUM: 7.8 mg/dL — AB (ref 8.9–10.3)
CO2: 26 mmol/L (ref 22–32)
CREATININE: 0.83 mg/dL (ref 0.61–1.24)
Chloride: 101 mmol/L (ref 98–111)
GFR calc Af Amer: >60 mL/min (ref >60)
Glucose, Bld: 201 mg/dL — ABNORMAL HIGH (ref 70–99)
Potassium: 4.2 mmol/L (ref 3.5–5.1)
SODIUM: 136 mmol/L (ref 135–145)
TOTAL PROTEIN: 5.8 g/dL — AB (ref 6.5–8.1)
Total Bilirubin: 0.8 mg/dL (ref 0.3–1.2)

## 2018-07-20 MED ORDER — SODIUM CHLORIDE 0.9 % IV SOLN
2.0000 g | INTRAVENOUS | Status: AC
  Administered 2018-07-21 – 2018-07-22 (×2): 2 g via INTRAVENOUS
  Filled 2018-07-20 (×2): qty 20

## 2018-07-20 MED ORDER — INSULIN ASPART 100 UNIT/ML ~~LOC~~ SOLN
0.0000 [IU] | Freq: Three times a day (TID) | SUBCUTANEOUS | Status: DC
  Administered 2018-07-20: 2 [IU] via SUBCUTANEOUS
  Administered 2018-07-21: 3 [IU] via SUBCUTANEOUS
  Administered 2018-07-21 (×2): 2 [IU] via SUBCUTANEOUS
  Administered 2018-07-21: 3 [IU] via SUBCUTANEOUS
  Administered 2018-07-22 (×4): 2 [IU] via SUBCUTANEOUS
  Administered 2018-07-23: 3 [IU] via SUBCUTANEOUS
  Administered 2018-07-23: 2 [IU] via SUBCUTANEOUS

## 2018-07-20 MED ORDER — INSULIN GLARGINE 100 UNIT/ML ~~LOC~~ SOLN
10.0000 [IU] | Freq: Every day | SUBCUTANEOUS | Status: DC
  Administered 2018-07-20 – 2018-07-23 (×4): 10 [IU] via SUBCUTANEOUS
  Filled 2018-07-20 (×5): qty 0.1

## 2018-07-20 MED ORDER — METRONIDAZOLE IN NACL 5-0.79 MG/ML-% IV SOLN
500.0000 mg | Freq: Three times a day (TID) | INTRAVENOUS | Status: AC
  Administered 2018-07-21 – 2018-07-22 (×6): 500 mg via INTRAVENOUS
  Filled 2018-07-20 (×6): qty 100

## 2018-07-20 NOTE — Social Work (Signed)
CSW acknowledging consult for SNF, pt able to work with therapies, no PT follow up recommended.   CSW also acknowledging consult for "access meds at discharge." For medication access please consult RN Case Management.   CSW signing off. Please consult if any additional needs arise.  Doy HutchingIsabel H Seville Brick, LCSWA [facility] Clinical Social Work [phone]

## 2018-07-20 NOTE — Evaluation (Addendum)
Physical Therapy Evaluation Patient Details Name: Kurt Anderson MRN: [id number] DOB: July 08, 1971 Today's Date: 07/20/2018   History of Present Illness  Pt is a 47 y.o. M with significant PMH of diabetes mellitus type 2 who presents with abscess osteomyelitis and purulent drainage from Charcot foot on the right. Now s/p right transtibial amputation.  Clinical Impression  Pt admitted with above diagnosis. Pt currently with functional limitations due to the deficits listed below (see PT Problem List). On PT evaluation, patient demonstrates good strength and range of motion of right residual limb as well as excellent pain control. Hopping with the walker in room a distance of 30 feet with min guard assist. Overall no unsteadiness or loss of balance using external support. Provided education on expectations for prosthetic use, knee precautions to prevent contractures, amputee exercises, and fall precautions. Will benefit from continued gait training, strengthening, and stair training prior to discharge home.        Follow Up Recommendations No PT follow up (OPPT after patient receives prosthetic for gait training)    Equipment Recommendations  Rolling walker with 5" wheels    Recommendations for Other Services       Precautions / Restrictions Precautions Precautions: Fall Precaution Comments: wound vac Required Braces or Orthoses: Other Brace/Splint Other Brace/Splint: right limb guard Restrictions Weight Bearing Restrictions: Yes RLE Weight Bearing: Non weight bearing      Mobility  Bed Mobility Overal bed mobility: Modified Independent             General bed mobility comments: Increased time with HOB elevated   Transfers Overall transfer level: Needs assistance Equipment used: Rolling walker (2 wheeled) Transfers: Sit to/from Stand Sit to Stand: Min guard         General transfer comment: min guard for safety  Ambulation/Gait Ambulation/Gait assistance: Min  guard Gait Distance (Feet): 30 Feet Assistive device: Rolling walker (2 wheeled) Gait Pattern/deviations: (hop to pattern ) Gait velocity: decr   General Gait Details: Patient with hop to pattern ambulating in room with walker (deferred hallway ambulation). Demonstrates good, upright posture and no overt LOB.  Stairs            Wheelchair Mobility    Modified Rankin (Stroke Patients Only)       Balance Overall balance assessment: Needs assistance Sitting-balance support: No upper extremity supported;Feet supported Sitting balance-Leahy Scale: Good     Standing balance support: Bilateral upper extremity supported Standing balance-Leahy Scale: Fair                               Pertinent Vitals/Pain Pain Assessment: Faces Faces Pain Scale: Hurts a little bit Pain Location: right residual limb Pain Descriptors / Indicators: Operative site guarding Pain Intervention(s): Monitored during session    Home Living Family/patient expects to be discharged to:: Private residence Living Arrangements: Non-relatives/Friends(brother) Available Help at Discharge: Family Type of Home: Mobile home Home Access: Stairs to enter Entrance Stairs-Rails: Can reach both Entrance Stairs-Number of Steps: 4 Home Layout: One level Home Equipment: Crutches      Prior Function Level of Independence: Independent         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 Able to perform SLR.  No contracture noted.    Cervical / Trunk Assessment Cervical / Trunk Assessment: Normal  Communication   Communication: Prefers language other than English(Pt understands most [place]; Hydrographic surveyor, interpreter used)  Cognition Arousal/Alertness:  Awake/alert Behavior During Therapy: WFL for tasks assessed/performed Overall Cognitive Status: Within Functional Limits for tasks assessed                                        General Comments      Exercises Amputee Exercises Quad Sets: 15 reps;Right;Seated Hip ABduction/ADduction: 15 reps;Right;Supine Straight Leg Raises: 10 reps;Right;Supine   Assessment/Plan    PT Assessment Patient needs continued PT services  PT Problem List Decreased activity tolerance;Decreased balance;Decreased mobility;Pain       PT Treatment Interventions DME instruction;Gait training;Stair training;Functional mobility training;Therapeutic activities;Therapeutic exercise;Balance training;Patient/family education    PT Goals (Current goals can be found in the Care Plan section)  Acute Rehab PT Goals Patient Stated Goal: "get a prosthetic" PT Goal Formulation: With patient Time For Goal Achievement: 08/03/18 Potential to Achieve Goals: Good    Frequency Min 5X/week   Barriers to discharge        Co-evaluation               AM-PAC PT "6 Clicks" Daily Activity  Outcome Measure Difficulty turning over in bed (including adjusting bedclothes, sheets and blankets)?: None Difficulty moving from lying on back to sitting on the side of the bed? : None Difficulty sitting down on and standing up from a chair with arms (e.g., wheelchair, bedside commode, etc,.)?: A Little Help needed moving to and from a bed to chair (including a wheelchair)?: A Little Help needed walking in hospital room?: A Little Help needed climbing 3-5 steps with a railing? : A Little 6 Click Score: 20    End of Session Equipment Utilized During Treatment: Gait belt;Other (comment)(right limb guard) Activity Tolerance: Patient tolerated treatment well Patient left: in chair;with call bell/phone within reach Nurse Communication: Mobility status PT Visit Diagnosis: Unsteadiness on feet (R26.81);Difficulty  in walking, not elsewhere classified (R26.2)    Time: 6962-9528 PT Time Calculation (min) (ACUTE ONLY): 32 min   Charges:   PT Evaluation $PT Eval Low Complexity: 1 Low PT Treatments $Therapeutic Activity: 8-22 mins        Laurina Bustle, PT, DPT Acute Rehabilitation Services  Pager: [phone]   Vanetta Mulders 07/20/2018, 2:42 PM

## 2018-07-20 NOTE — Progress Notes (Signed)
PROGRESS NOTE    Kurt Anderson  ZOX:[id number] DOB: 27-Jan-1971 DOA: 07/17/2018 PCP: Ardyth Man, MD    Brief Narrative:  47 year old gentleman history of type 2 diabetes, history of Charcot joint status post right big toe amputation 2018, who presented to the ED with right foot abscess, osteomyelitis with purulent drainage from Charcot foot on the right with diabetic insensate and neuropathy.  Patient placed empirically on IV antibiotics.  Orthopedics consulted and patient scheduled for trans-metatarsal amputation 07/19/2018.  Assessment & Plan:   Principal Problem:   Acute osteomyelitis of right foot (HCC) Active Problems:   Type 2 diabetes mellitus (HCC)   Anemia   Abnormal LFTs   Hyponatremia   Hypoalbuminemia   Proteinuria   Iron deficiency anemia  1 right foot abscess/osteomyelitis with ulceration and purulent drainage Patient had presented with right foot abscess with purulent drainage imaging consistent with osteomyelitis.  Blood cultures obtained and are pending. 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  Per orthopedics.  We will continue IV vancomycin, IV Rocephin and IV Flagyl for 48 hours postoperatively.  Discussed with ID.  Follow.  2.  Type 2 diabetes mellitus Hemoglobin A1c 7.4.  It was 201 this morning.  Placed on Lantus 10 units daily.  Continue sliding scale insulin.   3.  Iron deficiency anemia Patient with a hemoglobin of 9.5 from 9.9 from 12.7 on admission.  Likely dilutional.  No overt bleeding.  Will likely need oral iron supplementation on discharge.  Follow H&H.  4.  Hyponatremia Likely secondary to hypovolemic hyponatremia.  Improved with hydration.  Saline lock IV fluids.   5.  Abnormal liver function test LFTs trending down.  Acute hepatitis panel negative.  HIV nonreactive.  Follow.    6.  Hypoalbuminemia Likely multifactorial.  7.  Proteinuria Outpatient follow-up with PCP.   DVT  prophylaxis: SCDs.  Postop per orthopedics. Code Status: Full Family Communication: Updated patient.  No family at bedside. Disposition Plan: Likely home with home health.   Consultants:   Orthopedics: Dr. Lajoyce Corners 07/18/2018  Orthopedics: Dr. Eulah Pont 07/18/2018  Wound care Cammie Mcgee, RN 07/18/2018  Procedures:   ABI with TBI 07/18/2018  Plain films of the right foot 07/17/2018  CT right lower extremity 07/17/2018  Right upper quadrant ultrasound 07/18/2018  Transtibial amputation, application of Praveena wound VAC per Dr. Lajoyce Corners 07/19/2018  Antimicrobials:   IV Rocephin 07/18/2018>>>>>  IV Flagyl 07/18/2018  IV vancomycin 07/17/2018   Subjective: In bed.  No chest pain.  No shortness of breath.  States pain medication is controlling pain.   Objective: Vitals:   07/19/18 1734 07/19/18 2019 07/19/18 2352 07/20/18 0417  BP: 118/74 136/81 127/73 118/70  Pulse: 63 72 70 78  Resp: 18 16 16 16   Temp: 98.4 F (36.9 C) 98.1 F (36.7 C) 97.8 F (36.6 C) 98.7 F (37.1 C)  TempSrc: Oral Oral Oral Oral  SpO2: 98% 99% 98% 97%  Weight:      Height:        Intake/Output Summary (Last 24 hours) at 07/20/2018 1344 Last data filed at 07/20/2018 0900 Gross per 24 hour  Intake 1696.11 ml  Output 2150 ml  Net -453.89 ml   Filed Weights   07/17/18 2038 07/17/18 2337 07/19/18 1436  Weight: 98.9 kg 98.9 kg 98.9 kg    Examination:  General exam: NAD Respiratory system: Lungs clear to auscultation bilaterally.  No wheezes, no crackles, no rhonchi.  Cardiovascular system: Regular rate and rhythm no  murmurs rubs or gallops.  No JVD.  No lower extremity edema.   Gastrointestinal system: Abdomen is soft, nontender, nondistended, positive bowel sounds.  No rebound.  No guarding.  Central nervous system: Alert and oriented. No focal neurological deficits. Extremities: Status post right transtibial amputation with stump shrinker and stump protector on.  Skin: No rashes, lesions or ulcers Psychiatry:  Judgement and insight appear normal. Mood & affect appropriate.     Data Reviewed: I have personally reviewed following labs and imaging studies  CBC: Recent Labs  Lab 07/17/18 2102 07/19/18 0421 07/20/18 0339  WBC 16.4* 10.6* 8.8  NEUTROABS 12.9* 7.8* 6.4  HGB 12.7* 9.9* 9.5*  HCT 37.1* 30.8* 29.5*  MCV 83.9 85.3 85.0  PLT 434* 366 412*   Basic Metabolic Panel: Recent Labs  Lab 07/17/18 2102 07/19/18 0421 07/20/18 0339  NA 130* 132* 136  K 4.3 4.0 4.2  CL 94* 99 101  CO2 26 23 26   GLUCOSE 162* 213* 201*  BUN 22* 12 9  CREATININE 1.23 1.00 0.83  CALCIUM 8.4* 7.6* 7.8*   GFR: Estimated Creatinine Clearance: 124.6 mL/min (by C-G formula based on SCr of 0.83 mg/dL). Liver Function Tests: Recent Labs  Lab 07/17/18 2102 07/19/18 0421 07/20/18 0339  AST 53* 23 27  ALT 49* 26 24  ALKPHOS 151* 109 101  BILITOT 1.5* 0.8 0.8  PROT 8.1 6.0* 5.8*  ALBUMIN 2.8* 2.1* 2.0*   No results for input(s): LIPASE, AMYLASE in the last 168 hours. No results for input(s): AMMONIA in the last 168 hours. Coagulation Profile: No results for input(s): INR, PROTIME in the last 168 hours. Cardiac Enzymes: No results for input(s): CKTOTAL, CKMB, CKMBINDEX, TROPONINI in the last 168 hours. BNP (last 3 results) No results for input(s): PROBNP in the last 8760 hours. HbA1C: Recent Labs    07/17/18 2102  HGBA1C 7.4*   CBG: Recent Labs  Lab 07/19/18 1615 07/19/18 2017 07/19/18 2348 07/20/18 0414 07/20/18 1037  GLUCAP 134* 215* 181* 201* 207*   Lipid Profile: No results for input(s): CHOL, HDL, LDLCALC, TRIG, CHOLHDL, LDLDIRECT in the last 72 hours. Thyroid Function Tests: No results for input(s): TSH, T4TOTAL, FREET4, T3FREE, THYROIDAB in the last 72 hours. Anemia Panel: Recent Labs    07/18/18 0746  VITAMINB12 720  FOLATE 8.4  FERRITIN 1,349*  TIBC 130*  IRON 17*  RETICCTPCT 1.0   Sepsis Labs: Recent Labs  Lab 07/17/18 2112 07/17/18 2309  LATICACIDVEN 1.45  1.14    Recent Results (from the past 240 hour(s))  Blood Culture (routine x 2)     Status: None (Preliminary result)   Collection Time: 07/17/18  9:02 PM  Result Value Ref Range Status   Specimen Description BLOOD RIGHT ARM  Final   Special Requests   Final    BOTTLES DRAWN AEROBIC AND ANAEROBIC Blood Culture adequate volume   Culture   Final    NO GROWTH 3 DAYS Performed at [facility], [address], [place], [place] [address]    Report Status PENDING  Incomplete  Blood Culture (routine x 2)     Status: None (Preliminary result)   Collection Time: 07/17/18  9:05 PM  Result Value Ref Range Status   Specimen Description BLOOD LEFT ARM  Final   Special Requests   Final    BOTTLES DRAWN AEROBIC ONLY Blood Culture adequate volume   Culture   Final    NO GROWTH 3 DAYS Performed at [facility], [address], [place], [place]  [id number]    Report Status PENDING  Incomplete  Surgical pcr screen     Status: Abnormal   Collection Time: 07/18/18  6:27 PM  Result Value Ref Range Status   MRSA, PCR NEGATIVE NEGATIVE Final   Staphylococcus aureus POSITIVE (A) NEGATIVE Final    Comment: (NOTE) The Xpert SA Assay (FDA approved for NASAL specimens in patients 46 years of age and older), is one component of a comprehensive surveillance program. It is not intended to diagnose infection nor to guide or monitor treatment. Performed at [facility], [address] [place], [place] [address]          Radiology Studies: No results found.      Scheduled Meds: . Chlorhexidine Gluconate Cloth  6 each Topical Daily  . docusate sodium  100 mg Oral BID  . heparin  5,000 Units Subcutaneous Q8H  . insulin aspart  0-9 Units Subcutaneous TID AC & HS  . insulin glargine  10 Units Subcutaneous Daily  . mupirocin ointment  1 application Nasal BID   Continuous Infusions: . sodium chloride    . lactated ringers 10 mL/hr at 07/19/18 1437  . methocarbamol (ROBAXIN) IV    .  metronidazole 500 mg (07/20/18 0924)  . vancomycin 1,000 mg (07/20/18 0420)     LOS: 2 days    Time spent: 35 minutes    Ramiro Harvestaniel Gerard Cantara, MD [facility] Pager [phone]  If 7PM-7AM, please contact night-coverage [email or web address] Password [email or web address] 07/20/2018, 1:44 PM

## 2018-07-20 NOTE — Progress Notes (Signed)
Patient ID: Kurt Anderson, male   DOB: 04/11/1971, 47 y.o.   MRN: [id number] Patient is postoperative day 1 right transtibial amputation.  There is no drainage in the wound VAC canister.  Patient anticipate discharge to home with home health therapy the wound VAC will remain in place for 1 week with the portable Praveena pump.  [employer] to provide a stump shrinker and stump protector.

## 2018-07-20 NOTE — Progress Notes (Signed)
Pt requests to change frequency of blood sugars to before meals and bedtime.  Call placed to MD via Amion to request.  Awaiting response.  [email or web address]

## 2018-07-20 NOTE — Progress Notes (Signed)
Orthopedic Tech Progress Note Patient Details:  Kurt Anderson Feb 25, 1971 [id number]  Patient ID: Kurt Anderson, male   DOB: Feb 25, 1971, 47 y.o.   MRN: [id number]   Kurt Anderson, Kurt Anderson 07/20/2018, 9:29 AM Called in bio-[employer] brace order; spoke with Wylene MenLacey

## 2018-07-21 DIAGNOSIS — R112 Nausea with vomiting, unspecified: Secondary | ICD-10-CM

## 2018-07-21 LAB — CBC WITH DIFFERENTIAL/PLATELET
ABS IMMATURE GRANULOCYTES: 0.1 10*3/uL (ref 0.0–0.1)
BASOS PCT: 1 %
Basophils Absolute: 0.1 10*3/uL (ref 0.0–0.1)
EOS PCT: 2 %
Eosinophils Absolute: 0.2 10*3/uL (ref 0.0–0.7)
HCT: 30.6 % — ABNORMAL LOW (ref 39.0–52.0)
HEMOGLOBIN: 9.5 g/dL — AB (ref 13.0–17.0)
Immature Granulocytes: 1 %
Lymphocytes Relative: 22 %
Lymphs Abs: 1.7 10*3/uL (ref 0.7–4.0)
MCH: 26.6 pg (ref 26.0–34.0)
MCHC: 31 g/dL (ref 30.0–36.0)
MCV: 85.7 fL (ref 78.0–100.0)
MONO ABS: 0.6 10*3/uL (ref 0.1–1.0)
Monocytes Relative: 7 %
NEUTROS ABS: 5.1 10*3/uL (ref 1.7–7.7)
Neutrophils Relative %: 67 %
PLATELETS: 451 10*3/uL — AB (ref 150–400)
RBC: 3.57 MIL/uL — AB (ref 4.22–5.81)
RDW: 13.2 % (ref 11.5–15.5)
WBC: 7.8 10*3/uL (ref 4.0–10.5)

## 2018-07-21 LAB — BASIC METABOLIC PANEL
Anion gap: 10 (ref 5–15)
BUN: 7 mg/dL (ref 6–20)
CHLORIDE: 101 mmol/L (ref 98–111)
CO2: 25 mmol/L (ref 22–32)
Calcium: 7.7 mg/dL — ABNORMAL LOW (ref 8.9–10.3)
Creatinine, Ser: 0.81 mg/dL (ref 0.61–1.24)
Glucose, Bld: 238 mg/dL — ABNORMAL HIGH (ref 70–99)
Potassium: 4.1 mmol/L (ref 3.5–5.1)
SODIUM: 136 mmol/L (ref 135–145)

## 2018-07-21 LAB — GLUCOSE, CAPILLARY
GLUCOSE-CAPILLARY: 176 mg/dL — AB (ref 70–99)
GLUCOSE-CAPILLARY: 187 mg/dL — AB (ref 70–99)
Glucose-Capillary: 206 mg/dL — ABNORMAL HIGH (ref 70–99)
Glucose-Capillary: 232 mg/dL — ABNORMAL HIGH (ref 70–99)
Glucose-Capillary: 152 mg/dL — ABNORMAL HIGH (ref 70–99)

## 2018-07-21 MED ORDER — OXYCODONE HCL 5 MG PO TABS: 5.0000 mg | ORAL_TABLET | ORAL | Status: DC | PRN

## 2018-07-21 MED ORDER — HYDROCODONE-ACETAMINOPHEN 5-325 MG PO TABS
1.0000 | ORAL_TABLET | Freq: Four times a day (QID) | ORAL | Status: DC | PRN
Start: 2018-07-21 — End: 2018-07-23
  Administered 2018-07-22: 1 via ORAL
  Filled 2018-07-21: qty 1

## 2018-07-21 MED ORDER — POLYETHYLENE GLYCOL 3350 17 G PO PACK
17.0000 g | PACK | Freq: Two times a day (BID) | ORAL | Status: DC
  Administered 2018-07-22 – 2018-07-23 (×3): 17 g via ORAL
  Filled 2018-07-21 (×4): qty 1

## 2018-07-21 MED ORDER — SENNOSIDES-DOCUSATE SODIUM 8.6-50 MG PO TABS
1.0000 | ORAL_TABLET | Freq: Two times a day (BID) | ORAL | Status: DC
  Administered 2018-07-22 – 2018-07-23 (×3): 1 via ORAL
  Filled 2018-07-21 (×4): qty 1

## 2018-07-21 NOTE — Progress Notes (Signed)
Subjective: 2 Days Post-Op Procedure(s) (LRB): AMPUTATION BELOW KNEE (Right) Patient reports nausea with pain medications. Unable to eat much yet due to N/V. Patient thinks medication may be too strong. Will try some hydrocodone instead and reinforced with patient to try and eat something prior to taking medications for pain.  Stump shrinker in place and VAC dressing in place with some scant serosanguinous drainage in canister.   Objective: Vital signs in last 24 hours: Temp:  [97.6 F (36.4 C)-98.4 F (36.9 C)] 98.4 F (36.9 C) (08/09 0609) Pulse Rate:  [62-64] 64 (08/09 0609) Resp:  [15] 15 (08/09 0609) BP: (120)/(74-82) 120/82 (08/09 0609) SpO2:  [100 %] 100 % (08/09 0609)  Intake/Output from previous day: 08/08 0701 - 08/09 0700 In: 1600 [P.O.:1200; IV Piggyback:400] Out: 2720 [Urine:2720] Intake/Output this shift: No intake/output data recorded.  Recent Labs    07/19/18 0421 07/20/18 0339 07/21/18 0311  HGB 9.9* 9.5* 9.5*   Recent Labs    07/20/18 0339 07/21/18 0311  WBC 8.8 7.8  RBC 3.47* 3.57*  HCT 29.5* 30.6*  PLT 412* 451*   Recent Labs    07/20/18 0339 07/21/18 0311  NA 136 136  K 4.2 4.1  CL 101 101  CO2 26 25  BUN 9 7  CREATININE 0.83 0.81  GLUCOSE 201* 238*  CALCIUM 7.8* 7.7*   No results for input(s): LABPT, INR in the last 72 hours.  Neurologically intact Right BKA with stump shrinker in place and VAC dressing over incision.     Assessment/Plan: 2 Days Post-Op Procedure(s) (LRB): AMPUTATION BELOW KNEE (Right) Okay for discharge home from orthopedic standpoint when okay'ed by primary.  DC with Prevena portable VAC machine Follow up with Dr. Lajoyce Cornersuda next week.     Kurt Anderson 07/21/2018, 9:51 AM  [phone]

## 2018-07-21 NOTE — Evaluation (Signed)
Occupational Therapy Evaluation Patient Details Name: Kurt CongGabriel Silva-Galvan MRN: [id number] DOB: [birth year] Today's Date: 07/21/2018    History of Present Illness Pt is a 47 y.o. M with significant PMH of diabetes mellitus type 2 who presents with abscess osteomyelitis and purulent drainage from Charcot foot on the right. Now s/p right transtibial amputation.   Clinical Impression   Eval limited due to N/V. RN aware and pt has been given Zofran. Pt with decline in function and safety with ADLs and ADL mobility with decreased balance and endurance. Pt requires set up with UB ADLs and grooming while seated and mod A with LB ADLs. Pt educated on use of tub bench and A/E for LB. Pt would benefit from acute OT services to address impairments and maximize level of function and safety    Follow Up Recommendations  No OT follow up    Equipment Recommendations  Tub/shower bench;Other (comment)(ADL A/E)    Recommendations for Other Services       Precautions / Restrictions Precautions Precautions: Fall Precaution Comments: wound vac Required Braces or Orthoses: Other Brace/Splint Other Brace/Splint: right limb guard Restrictions Weight Bearing Restrictions: Yes RLE Weight Bearing: Non weight bearing      Mobility Bed Mobility               General bed mobility comments: pt up in recliner upon arrival. per PT note Mod I  Transfers Overall transfer level: Needs assistance               General transfer comment: pt unable due to N/V. Per PT note pt is min guard A    Balance                                           ADL either performed or assessed with clinical judgement   ADL Overall ADL's : Needs assistance/impaired     Grooming: Wash/dry hands;Sitting;Set up   Upper Body Bathing: Set up;Sitting Upper Body Bathing Details (indicate cue type and reason): simulated Lower Body Bathing: Moderate assistance;Sitting/lateral leans Lower Body Bathing  Details (indicate cue type and reason): simulated Upper Body Dressing : Set up;Sitting   Lower Body Dressing: Moderate assistance;Sitting/lateral leans Lower Body Dressing Details (indicate cue type and reason): educated pt on use of reacher for LB dressing   Toilet Transfer Details (indicate cue type and reason): NT due to N/V, per PT note pt transfers with min guard A           General ADL Comments: Pt educated on use of tub bench with demo and with ADL A/E, handout provided     Vision Baseline Vision/History: No visual deficits Patient Visual Report: No change from baseline       Perception     Praxis      Pertinent Vitals/Pain Pain Assessment: Faces Faces Pain Scale: Hurts little more Pain Location: right residual limb Pain Descriptors / Indicators: Operative site guarding Pain Intervention(s): Limited activity within patient's tolerance;Monitored during session     Hand Dominance Right   Extremity/Trunk Assessment Upper Extremity Assessment Upper Extremity Assessment: Overall WFL for tasks assessed   Lower Extremity Assessment Lower Extremity Assessment: Defer to PT evaluation   Cervical / Trunk Assessment Cervical / Trunk Assessment: Normal   Communication Communication Communication: Prefers language other than English(speaks Spanish)   Cognition Arousal/Alertness: Awake/alert Behavior During Therapy: WFL for tasks assessed/performed Overall Cognitive Status:  Within Functional Limits for tasks assessed                                     General Comments       Exercises     Shoulder Instructions      Home Living Family/patient expects to be discharged to:: Private residence Living Arrangements: Other relatives(brother) Available Help at Discharge: Family Type of Home: Mobile home Home Access: Stairs to enter Entrance Stairs-Number of Steps: 4 Entrance Stairs-Rails: Can reach both Home Layout: One level     Bathroom  Shower/Tub: Chief Strategy Officer: Standard     Home Equipment: Crutches          Prior Functioning/Environment Level of Independence: Independent        Comments: works in Garment/textile technologist Problem List: Decreased activity tolerance;Decreased knowledge of use of DME or AE;Impaired balance (sitting and/or standing);Pain      OT Treatment/Interventions: Self-care/ADL training;DME and/or AE instruction;Therapeutic activities;Patient/family education    OT Goals(Current goals can be found in the care plan section) Acute Rehab OT Goals Patient Stated Goal: "get a prosthetic" OT Goal Formulation: With patient Time For Goal Achievement: 08/04/18 Potential to Achieve Goals: Good ADL Goals Pt Will Perform Lower Body Bathing: with min assist;with caregiver independent in assisting;with adaptive equipment Pt Will Perform Lower Body Dressing: with min assist;with adaptive equipment;with caregiver independent in assisting Pt Will Transfer to Toilet: with min guard assist;with supervision;regular height toilet;bedside commode Pt Will Perform Toileting - Clothing Manipulation and hygiene: with min assist;sitting/lateral leans;with caregiver independent in assisting Pt Will Perform Tub/Shower Transfer: with min guard assist;with supervision;tub bench;with caregiver independent in assisting  OT Frequency: Min 2X/week   Barriers to D/C:    no barriers       Co-evaluation              AM-PAC PT "6 Clicks" Daily Activity     Outcome Measure Help from another person eating meals?: None Help from another person taking care of personal grooming?: A Little Help from another person toileting, which includes using toliet, bedpan, or urinal?: A Lot Help from another person bathing (including washing, rinsing, drying)?: A Little Help from another person to put on and taking off regular upper body clothing?: A Little Help from another person to put on and taking off  regular lower body clothing?: A Lot 6 Click Score: 17   End of Session Equipment Utilized During Treatment: Other (comment)(tub bench)  Activity Tolerance: Other (comment)(N/V) Patient left: in chair;with call bell/phone within reach;with nursing/sitter in room  OT Visit Diagnosis: Other abnormalities of gait and mobility (R26.89);Muscle weakness (generalized) (M62.81);Pain Pain - Right/Left: Right Pain - part of body: Leg                Time: 1610-9604 OT Time Calculation (min): 17 min Charges:  OT General Charges $OT Visit: 1 Visit OT Evaluation $OT Eval Low Complexity: 1 Low   Galen Manila 07/21/2018, 12:33 PM

## 2018-07-21 NOTE — Plan of Care (Signed)

## 2018-07-21 NOTE — Progress Notes (Signed)
Physical Therapy Treatment Patient Details Name: Kurt Anderson MRN: [id number] DOB: [birth year] Today's Date: 07/21/2018    History of Present Illness Pt is a 47 y.o. M with significant PMH of diabetes mellitus type 2 who presents with abscess osteomyelitis and purulent drainage from Charcot foot on the right. Now s/p right transtibial amputation.    PT Comments    Deferred stair training this session as patient was limited by nausea/vomiting. Assisted patient with ambulation to and from bathroom. Patient continues to hop with a walker at a min guard assist level. Displays improved ease of transfer and mobility this session. Rest of session focused on instruction patient on and performing bed level amputee exercises and written handout provided with dosage and frequency. Reviewed prosthetic expectations. Will continue to progress mobility as tolerated.     Follow Up Recommendations  No PT follow up (will benefit from OPPT after prosthetic fitting for gait training)     Equipment Recommendations  Rolling walker with 5" wheels    Recommendations for Other Services       Precautions / Restrictions Precautions Precautions: Fall Precaution Comments: wound vac Required Braces or Orthoses: Other Brace/Splint Other Brace/Splint: right limb guard Restrictions Weight Bearing Restrictions: Yes RLE Weight Bearing: Non weight bearing    Mobility  Bed Mobility Overal bed mobility: Modified Independent             General bed mobility comments: Increased time with HOB elevated   Transfers Overall transfer level: Needs assistance Equipment used: Rolling walker (2 wheeled) Transfers: Sit to/from Stand Sit to Stand: Min guard         General transfer comment: min guard for safety. cues for hand placement and patient with good carryover on transition back to bed with stand to sit  Ambulation/Gait Ambulation/Gait assistance: Min guard Gait Distance (Feet): 20 Feet Assistive  device: Rolling walker (2 wheeled) Gait Pattern/deviations: (hop to pattern ) Gait velocity: decr   General Gait Details: Patient with hop to pattern ambulating in room with walker. Demonstrates good, upright posture and no overt LOB.   Stairs             Wheelchair Mobility    Modified Rankin (Stroke Patients Only)       Balance Overall balance assessment: Needs assistance Sitting-balance support: No upper extremity supported;Feet supported Sitting balance-Leahy Scale: Good     Standing balance support: Bilateral upper extremity supported Standing balance-Leahy Scale: Fair Standing balance comment: Able to statically stand and lean against sink to wash hands                            Cognition Arousal/Alertness: Awake/alert Behavior During Therapy: WFL for tasks assessed/performed Overall Cognitive Status: Within Functional Limits for tasks assessed                                        Exercises Amputee Exercises Quad Sets: Right;Supine;10 reps Hip Extension: Supine;Right;10 reps Hip ABduction/ADduction: Right;Supine;10 reps Straight Leg Raises: 10 reps;Right;Supine Other Exercises Other Exercises: Verbally reviewed and demonstrated LAQ's     General Comments        Pertinent Vitals/Pain Pain Assessment: Faces Faces Pain Scale: Hurts a little bit Pain Location: right residual limb Pain Descriptors / Indicators: Operative site guarding Pain Intervention(s): Limited activity within patient's tolerance;Monitored during session    Home Living Family/patient expects to be  discharged to:: Private residence Living Arrangements: Other relatives(brother) Available Help at Discharge: Family Type of Home: Mobile home Home Access: Stairs to enter Entrance Stairs-Rails: Can reach both Home Layout: One level Home Equipment: Crutches      Prior Function Level of Independence: Independent      Comments: works in Holiday representativeconstruction    PT Goals (current goals can now be found in the care plan section) Acute Rehab PT Goals Patient Stated Goal: "get a prosthetic" PT Goal Formulation: With patient Time For Goal Achievement: 08/03/18 Potential to Achieve Goals: Good Progress towards PT goals: Progressing toward goals    Frequency    Min 5X/week      PT Plan Current plan remains appropriate    Co-evaluation              AM-PAC PT "6 Clicks" Daily Activity  Outcome Measure  Difficulty turning over in bed (including adjusting bedclothes, sheets and blankets)?: None Difficulty moving from lying on back to sitting on the side of the bed? : None Difficulty sitting down on and standing up from a chair with arms (e.g., wheelchair, bedside commode, etc,.)?: A Little Help needed moving to and from a bed to chair (including a wheelchair)?: A Little Help needed walking in hospital room?: A Little Help needed climbing 3-5 steps with a railing? : A Little 6 Click Score: 20    End of Session Equipment Utilized During Treatment: Gait belt Activity Tolerance: Patient tolerated treatment well Patient left: with call bell/phone within reach;in bed Nurse Communication: Mobility status PT Visit Diagnosis: Unsteadiness on feet (R26.81);Difficulty in walking, not elsewhere classified (R26.2)     Time: [id number] PT Time Calculation (min) (ACUTE ONLY): 24 min  Charges:  $Therapeutic Exercise: 8-22 mins $Therapeutic Activity: 8-22 mins                     Kurt Anderson, PT, DPT Acute Rehabilitation Services  Pager: [phone]    Kurt Anderson 07/21/2018, 4:17 PM

## 2018-07-21 NOTE — Progress Notes (Signed)
PROGRESS NOTE    Kurt Anderson  NFA:[id number] DOB: [birth year] DOA: 07/17/2018 PCP: Ardyth ManPark, Chan M, MD    Brief Narrative:  47 year old gentleman history of type 2 diabetes, history of Charcot joint status post right big toe amputation 2018, who presented to the ED with right foot abscess, osteomyelitis with purulent drainage from Charcot foot on the right with diabetic insensate and neuropathy.  Patient placed empirically on IV antibiotics.  Orthopedics consulted and patient scheduled for trans-metatarsal amputation 07/19/2018.  Assessment & Plan:   Principal Problem:   Acute osteomyelitis of right foot (HCC) Active Problems:   Type 2 diabetes mellitus (HCC)   Anemia   Abnormal LFTs   Hyponatremia   Hypoalbuminemia   Proteinuria   Iron deficiency anemia  1 right foot abscess/osteomyelitis with ulceration and purulent drainage Patient had presented with right foot abscess with purulent drainage imaging consistent with osteomyelitis.  Blood cultures obtained and are pending. 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  Per orthopedics.  Continue empiric IV vancomycin, IV Rocephin and IV Flagyl for 48 hours postoperatively.  Discussed with ID.  Follow.  2.  Type 2 diabetes mellitus Hemoglobin A1c 7.4.  CBG at 176 this morning.  Continue current regimen of Lantus and sliding scale insulin.   3.  Iron deficiency anemia Patient with a hemoglobin which seems to have stabilized at 9.5 from 9.5 from 9.9 from 12.7 on admission.  Likely dilutional.  No overt bleeding.  Will likely need oral iron supplementation on discharge.  Follow H&H.  4.  Hyponatremia Likely secondary to hypovolemic hyponatremia.  Improved with hydration.  Saline lock IV fluids.  5.  Abnormal liver function test LFTs trending down.  Acute hepatitis panel negative.  HIV nonreactive.  Follow.    6.  Hypoalbuminemia Likely multifactorial.  7.  Proteinuria Outpatient  follow-up with PCP.  8.  Nausea/emesis Questionable etiology.  Concern for probable constipation as previously and states had a small hard stool.  We will give a soapsuds enema and if no results with ongoing nausea and emesis will check a KUB.  Supportive care.   DVT prophylaxis: SCDs.  Postop per orthopedics. Code Status: Full Family Communication: Updated patient.  No family at bedside. Disposition Plan: Likely home with home health when clinically improved and completion of IV antibiotics hopefully the next 24 to 48 hours.   Consultants:   Orthopedics: Dr. Lajoyce Cornersuda 07/18/2018  Orthopedics: Dr. Eulah PontMurphy 07/18/2018  Wound care Cammie Mcgeeawn Engels, RN 07/18/2018  Procedures:   ABI with TBI 07/18/2018  Plain films of the right foot 07/17/2018  CT right lower extremity 07/17/2018  Right upper quadrant ultrasound 07/18/2018  Transtibial amputation, application of Praveena wound VAC per Dr. Lajoyce Cornersuda 07/19/2018  Antimicrobials:   IV Rocephin 07/18/2018>>>>>  IV Flagyl 07/18/2018  IV vancomycin 07/17/2018   Subjective: Patient states had an episode of emesis this morning.  Also with complaints of nausea.  No chest pain.  No shortness of breath.  Patient feels nausea and emesis may have been triggered by pain medication which may be too high per patient.  Patient states had a small hard stool this morning yesterday.    Objective: Vitals:   07/19/18 2352 07/20/18 0417 07/20/18 1939 07/21/18 0609  BP: 127/73 118/70 120/74 120/82  Pulse: 70 78 62 64  Resp: 16 16 15 15   Temp: 97.8 F (36.6 C) 98.7 F (37.1 C) 97.6 F (36.4 C) 98.4 F (36.9 C)  TempSrc: Oral Oral Oral Oral  SpO2:  98% 97% 100% 100%  Weight:      Height:        Intake/Output Summary (Last 24 hours) at 07/21/2018 1254 Last data filed at 07/21/2018 0900 Gross per 24 hour  Intake 1480 ml  Output 2470 ml  Net -990 ml   Filed Weights   07/17/18 2038 07/17/18 2337 07/19/18 1436  Weight: 98.9 kg 98.9 kg 98.9 kg    Examination:  General  exam: NAD Respiratory system: CTAB.  No wheezes, no crackles, no rhonchi.  Cardiovascular system: RRR no murmurs rubs or gallops.  No JVD.  No lower extremity edema.   Gastrointestinal system: Abdomen is nontender, nondistended, soft, positive bowel sounds.  No rebound.  No guarding.  Central nervous system: Alert and oriented. No focal neurological deficits. Extremities: Status post right transtibial amputation with stump shrinker and stump protector on.  Skin: No rashes, lesions or ulcers Psychiatry: Judgement and insight appear normal. Mood & affect appropriate.     Data Reviewed: I have personally reviewed following labs and imaging studies  CBC: Recent Labs  Lab 07/17/18 2102 07/19/18 0421 07/20/18 0339 07/21/18 0311  WBC 16.4* 10.6* 8.8 7.8  NEUTROABS 12.9* 7.8* 6.4 5.1  HGB 12.7* 9.9* 9.5* 9.5*  HCT 37.1* 30.8* 29.5* 30.6*  MCV 83.9 85.3 85.0 85.7  PLT 434* 366 412* 451*   Basic Metabolic Panel: Recent Labs  Lab 07/17/18 2102 07/19/18 0421 07/20/18 0339 07/21/18 0311  NA 130* 132* 136 136  K 4.3 4.0 4.2 4.1  CL 94* 99 101 101  CO2 26 23 26 25   GLUCOSE 162* 213* 201* 238*  BUN 22* 12 9 7   CREATININE 1.23 1.00 0.83 0.81  CALCIUM 8.4* 7.6* 7.8* 7.7*   GFR: Estimated Creatinine Clearance: 127.7 mL/min (by C-G formula based on SCr of 0.81 mg/dL). Liver Function Tests: Recent Labs  Lab 07/17/18 2102 07/19/18 0421 07/20/18 0339  AST 53* 23 27  ALT 49* 26 24  ALKPHOS 151* 109 101  BILITOT 1.5* 0.8 0.8  PROT 8.1 6.0* 5.8*  ALBUMIN 2.8* 2.1* 2.0*   No results for input(s): LIPASE, AMYLASE in the last 168 hours. No results for input(s): AMMONIA in the last 168 hours. Coagulation Profile: No results for input(s): INR, PROTIME in the last 168 hours. Cardiac Enzymes: No results for input(s): CKTOTAL, CKMB, CKMBINDEX, TROPONINI in the last 168 hours. BNP (last 3 results) No results for input(s): PROBNP in the last 8760 hours. HbA1C: No results for input(s):  HGBA1C in the last 72 hours. CBG: Recent Labs  Lab 07/20/18 1721 07/20/18 2137 07/21/18 0721 07/21/18 0824 07/21/18 1149  GLUCAP 155* 168* 176* 187* 206*   Lipid Profile: No results for input(s): CHOL, HDL, LDLCALC, TRIG, CHOLHDL, LDLDIRECT in the last 72 hours. Thyroid Function Tests: No results for input(s): TSH, T4TOTAL, FREET4, T3FREE, THYROIDAB in the last 72 hours. Anemia Panel: No results for input(s): VITAMINB12, FOLATE, FERRITIN, TIBC, IRON, RETICCTPCT in the last 72 hours. Sepsis Labs: Recent Labs  Lab 07/17/18 2112 07/17/18 2309  LATICACIDVEN 1.45 1.14    Recent Results (from the past 240 hour(s))  Blood Culture (routine x 2)     Status: None (Preliminary result)   Collection Time: 07/17/18  9:02 PM  Result Value Ref Range Status   Specimen Description BLOOD RIGHT ARM  Final   Special Requests   Final    BOTTLES DRAWN AEROBIC AND ANAEROBIC Blood Culture adequate volume   Culture   Final    NO GROWTH 4 DAYS Performed  at [facility], [address], [place], [place] [address]    Report Status PENDING  Incomplete  Blood Culture (routine x 2)     Status: None (Preliminary result)   Collection Time: 07/17/18  9:05 PM  Result Value Ref Range Status   Specimen Description BLOOD LEFT ARM  Final   Special Requests   Final    BOTTLES DRAWN AEROBIC ONLY Blood Culture adequate volume   Culture   Final    NO GROWTH 4 DAYS Performed at [facility], [address] [place], [place] [address]    Report Status PENDING  Incomplete  Surgical pcr screen     Status: Abnormal   Collection Time: 07/18/18  6:27 PM  Result Value Ref Range Status   MRSA, PCR NEGATIVE NEGATIVE Final   Staphylococcus aureus POSITIVE (A) NEGATIVE Final    Comment: (NOTE) The Xpert SA Assay (FDA approved for NASAL specimens in patients 72 years of age and older), is one component of a comprehensive surveillance program. It is not intended to diagnose infection nor to guide or monitor  treatment. Performed at [facility], [address], [place], [place] [address]          Radiology Studies: No results found.      Scheduled Meds: . Chlorhexidine Gluconate Cloth  6 each Topical Daily  . docusate sodium  100 mg Oral BID  . heparin  5,000 Units Subcutaneous Q8H  . insulin aspart  0-9 Units Subcutaneous TID AC & HS  . insulin glargine  10 Units Subcutaneous Daily  . mupirocin ointment  1 application Nasal BID   Continuous Infusions: . sodium chloride    . cefTRIAXone (ROCEPHIN)  IV Stopped (07/21/18 0745)  . methocarbamol (ROBAXIN) IV    . metronidazole 500 mg (07/21/18 1054)  . vancomycin 1,000 mg (07/21/18 1233)     LOS: 3 days    Time spent: 35 minutes    Ramiro Harvest, MD [facility] Pager [phone]  If 7PM-7AM, please contact night-coverage [email or web address] Password [email or web address] 07/21/2018, 12:54 PM

## 2018-07-21 NOTE — Progress Notes (Signed)
Pharmacy Antibiotic Note  Kurt Anderson is a 47 y.o. male transferred from [facility] on 07/17/2018 with right foot wound for 3 days.  Patient was given Zosyn and clindamycin in the ED and then replaced by Rocephin and Flagyl.  Imaging concerning for osteomyelitis and necrotizing fasciitis.  Pharmacy has been consulted for vancomycin dosing.  S/p transtibial amputation on 07/19/18 and antibiotics to stop 24 hours later per op-note but 48 hours per vancomycin consult on 07/20/18.  Renal function is improving, afebrile, WBC normalized.   Plan: Continue vanc 1gm IV Q8H Rocephin and Flagyl per MD Stop abx on 07/22/18   Height: 5\' 7"  (170.2 cm) Weight: 218 lb (98.9 kg) IBW/kg (Calculated) : 66.1  Temp (24hrs), Avg:98 F (36.7 C), Min:97.6 F (36.4 C), Max:98.4 F (36.9 C)  Recent Labs  Lab 07/17/18 2102 07/17/18 2112 07/17/18 2309 07/19/18 0421 07/20/18 0339 07/21/18 0311  WBC 16.4*  --   --  10.6* 8.8 7.8  CREATININE 1.23  --   --  1.00 0.83 0.81  LATICACIDVEN  --  1.45 1.14  --   --   --     Estimated Creatinine Clearance: 127.7 mL/min (by C-G formula based on SCr of 0.81 mg/dL).    No Known Allergies   Vanc 8/5 >> 8/10 CTX 8/6 >> 8/10 Flagyl 8/6 >> 8/10  8/5 BCx - NGTD 8/6 surgical screen - MSSA  Randen Kauth D. Laney Potashang, PharmD, BCPS, BCCCP 07/21/2018, 9:55 AM

## 2018-07-22 LAB — GLUCOSE, CAPILLARY
GLUCOSE-CAPILLARY: 189 mg/dL — AB (ref 70–99)
GLUCOSE-CAPILLARY: 189 mg/dL — AB (ref 70–99)
GLUCOSE-CAPILLARY: 173 mg/dL — AB (ref 70–99)
Glucose-Capillary: 188 mg/dL — ABNORMAL HIGH (ref 70–99)

## 2018-07-22 LAB — BASIC METABOLIC PANEL
ANION GAP: 8 (ref 5–15)
BUN: 8 mg/dL (ref 6–20)
CHLORIDE: 103 mmol/L (ref 98–111)
CO2: 27 mmol/L (ref 22–32)
Calcium: 7.9 mg/dL — ABNORMAL LOW (ref 8.9–10.3)
Creatinine, Ser: 0.87 mg/dL (ref 0.61–1.24)
GFR calc Af Amer: >60 mL/min (ref >60)
GFR calc non Af Amer: >60 mL/min (ref >60)
GLUCOSE: 159 mg/dL — AB (ref 70–99)
POTASSIUM: 3.7 mmol/L (ref 3.5–5.1)
Sodium: 138 mmol/L (ref 135–145)

## 2018-07-22 LAB — CULTURE, BLOOD (ROUTINE X 2)
CULTURE: NO GROWTH
Culture: NO GROWTH
SPECIAL REQUESTS: ADEQUATE
Special Requests: ADEQUATE

## 2018-07-22 LAB — CBC WITH DIFFERENTIAL/PLATELET
Abs Immature Granulocytes: 0.1 10*3/uL (ref 0.0–0.1)
BASOS PCT: 1 %
Basophils Absolute: 0.1 10*3/uL (ref 0.0–0.1)
EOS PCT: 3 %
Eosinophils Absolute: 0.3 10*3/uL (ref 0.0–0.7)
HCT: 31.6 % — ABNORMAL LOW (ref 39.0–52.0)
Hemoglobin: 9.9 g/dL — ABNORMAL LOW (ref 13.0–17.0)
Immature Granulocytes: 2 %
LYMPHS ABS: 2.1 10*3/uL (ref 0.7–4.0)
LYMPHS PCT: 25 %
MCH: 26.8 pg (ref 26.0–34.0)
MCHC: 31.3 g/dL (ref 30.0–36.0)
MCV: 85.6 fL (ref 78.0–100.0)
MONO ABS: 0.7 10*3/uL (ref 0.1–1.0)
Monocytes Relative: 8 %
Neutro Abs: 5.2 10*3/uL (ref 1.7–7.7)
Neutrophils Relative %: 61 %
PLATELETS: 527 10*3/uL — AB (ref 150–400)
RBC: 3.69 MIL/uL — AB (ref 4.22–5.81)
RDW: 13.3 % (ref 11.5–15.5)
WBC: 8.5 10*3/uL (ref 4.0–10.5)

## 2018-07-22 LAB — MAGNESIUM: Magnesium: 2 mg/dL (ref 1.7–2.4)

## 2018-07-22 NOTE — Progress Notes (Signed)
PROGRESS NOTE    Kurt Anderson  ZOX:[id number] DOB: 1971-02-24 DOA: 07/17/2018 PCP: Ardyth Man, MD    Brief Narrative:  47 year old gentleman history of type 2 diabetes, history of Charcot joint status post right big toe amputation 2018, who presented to the ED with right foot abscess, osteomyelitis with purulent drainage from Charcot foot on the right with diabetic insensate and neuropathy.  Patient placed empirically on IV antibiotics.  Orthopedics consulted and patient scheduled for trans-metatarsal amputation 07/19/2018.  Assessment & Plan:   Principal Problem:   Acute osteomyelitis of right foot (HCC) Active Problems:   Type 2 diabetes mellitus (HCC)   Anemia   Abnormal LFTs   Hyponatremia   Hypoalbuminemia   Proteinuria   Iron deficiency anemia  1 right foot abscess/osteomyelitis with ulceration and purulent drainage Patient had presented with right foot abscess with purulent drainage imaging consistent with osteomyelitis.  Blood cultures obtained and are pending. 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  Per orthopedics.  Continue empiric IV vancomycin, IV Rocephin and IV Flagyl for 48 hours postoperatively.  Discussed with ID.  Follow.  2.  Type 2 diabetes mellitus Hemoglobin A1c 7.4.  CBG at 188 this morning.  Continue current regimen of Lantus and sliding scale insulin.   3.  Iron deficiency anemia Patient with a hemoglobin which seems to have stabilized at 9.5 from 9.5 from 9.9 from 12.7 on admission.  Likely dilutional.  No overt bleeding.Start iron sulfate TID.  Follow H&H.  4.  Hyponatremia Likely secondary to hypovolemic hyponatremia.  Resolved with hydration.  Saline lock IV fluids.  5.  Abnormal liver function test LFTs trending down.  Acute hepatitis panel negative.  HIV nonreactive.  Follow.    6.  Hypoalbuminemia Likely multifactorial.  7.  Proteinuria Outpatient follow-up with PCP.  8.   Nausea/emesis likely secondary to constipation Likely secondary to constipation.  Clinical improvement after soapsuds enema with good bowel movement.  Continue MiraLAX and Senokot for bowel regimen.  Supportive care.    DVT prophylaxis: SCDs.  Postop per orthopedics. Code Status: Full Family Communication: Updated patient.  No family at bedside. Disposition Plan: Likely home with home health hopefully tomorrow once IV antibiotics have been completed.     Consultants:   Orthopedics: Dr. Lajoyce Corners 07/18/2018  Orthopedics: Dr. Eulah Pont 07/18/2018  Wound care Cammie Mcgee, RN 07/18/2018  Procedures:   ABI with TBI 07/18/2018  Plain films of the right foot 07/17/2018  CT right lower extremity 07/17/2018  Right upper quadrant ultrasound 07/18/2018  Transtibial amputation, application of Praveena wound VAC per Dr. Lajoyce Corners 07/19/2018  Antimicrobials:   IV Rocephin 07/18/2018>>>>> 07/22/2018  IV Flagyl 07/18/2018>>>> 07/22/2018  IV vancomycin 07/17/2018>>>>> 07/22/2018   Subjective: Patient sitting in chair.  No chest pain.  No shortness of breath.  No further nausea or vomiting.  States nausea vomiting improved after enema and having a good bowel movement yesterday.     Objective: Vitals:   07/21/18 0609 07/21/18 1506 07/21/18 1932 07/22/18 0517  BP: 120/82 117/70 115/66 125/76  Pulse: 64 69 71 69  Resp: 15 16    Temp: 98.4 F (36.9 C) 98.5 F (36.9 C) 98.4 F (36.9 C) 98.3 F (36.8 C)  TempSrc: Oral Oral Oral Oral  SpO2: 100% 100% 98% 99%  Weight:      Height:        Intake/Output Summary (Last 24 hours) at 07/22/2018 1316 Last data filed at 07/22/2018 0900 Gross per 24 hour  Intake 1466.03 ml  Output 1050 ml  Net 416.03 ml   Filed Weights   07/17/18 2038 07/17/18 2337 07/19/18 1436  Weight: 98.9 kg 98.9 kg 98.9 kg    Examination:  General exam: NAD Respiratory system: Lungs clear to auscultation bilaterally.  No wheezes, no crackles, no rhonchi.   Cardiovascular system: Regular rate  and rhythm no murmurs rubs or gallops.  No JVD.  No lower extremity edema. Gastrointestinal system: Abdomen is soft, nontender, nondistended, positive bowel sounds.  No rebound.  No guarding.  Central nervous system: Alert and oriented. No focal neurological deficits. Extremities: Status post right transtibial amputation with stump shrinker and stump protector on.  Skin: No rashes, lesions or ulcers Psychiatry: Judgement and insight appear normal. Mood & affect appropriate.     Data Reviewed: I have personally reviewed following labs and imaging studies  CBC: Recent Labs  Lab 07/17/18 2102 07/19/18 0421 07/20/18 0339 07/21/18 0311 07/22/18 0354  WBC 16.4* 10.6* 8.8 7.8 8.5  NEUTROABS 12.9* 7.8* 6.4 5.1 5.2  HGB 12.7* 9.9* 9.5* 9.5* 9.9*  HCT 37.1* 30.8* 29.5* 30.6* 31.6*  MCV 83.9 85.3 85.0 85.7 85.6  PLT 434* 366 412* 451* 527*   Basic Metabolic Panel: Recent Labs  Lab 07/17/18 2102 07/19/18 0421 07/20/18 0339 07/21/18 0311 07/22/18 0354  NA 130* 132* 136 136 138  K 4.3 4.0 4.2 4.1 3.7  CL 94* 99 101 101 103  CO2 26 23 26 25 27   GLUCOSE 162* 213* 201* 238* 159*  BUN 22* 12 9 7 8   CREATININE 1.23 1.00 0.83 0.81 0.87  CALCIUM 8.4* 7.6* 7.8* 7.7* 7.9*  MG  --   --   --   --  2.0   GFR: Estimated Creatinine Clearance: 118.9 mL/min (by C-G formula based on SCr of 0.87 mg/dL). Liver Function Tests: Recent Labs  Lab 07/17/18 2102 07/19/18 0421 07/20/18 0339  AST 53* 23 27  ALT 49* 26 24  ALKPHOS 151* 109 101  BILITOT 1.5* 0.8 0.8  PROT 8.1 6.0* 5.8*  ALBUMIN 2.8* 2.1* 2.0*   No results for input(s): LIPASE, AMYLASE in the last 168 hours. No results for input(s): AMMONIA in the last 168 hours. Coagulation Profile: No results for input(s): INR, PROTIME in the last 168 hours. Cardiac Enzymes: No results for input(s): CKTOTAL, CKMB, CKMBINDEX, TROPONINI in the last 168 hours. BNP (last 3 results) No results for input(s): PROBNP in the last 8760  hours. HbA1C: No results for input(s): HGBA1C in the last 72 hours. CBG: Recent Labs  Lab 07/21/18 1149 07/21/18 1627 07/21/18 2114 07/22/18 0650 07/22/18 1131  GLUCAP 206* 232* 152* 188* 189*   Lipid Profile: No results for input(s): CHOL, HDL, LDLCALC, TRIG, CHOLHDL, LDLDIRECT in the last 72 hours. Thyroid Function Tests: No results for input(s): TSH, T4TOTAL, FREET4, T3FREE, THYROIDAB in the last 72 hours. Anemia Panel: No results for input(s): VITAMINB12, FOLATE, FERRITIN, TIBC, IRON, RETICCTPCT in the last 72 hours. Sepsis Labs: Recent Labs  Lab 07/17/18 2112 07/17/18 2309  LATICACIDVEN 1.45 1.14    Recent Results (from the past 240 hour(s))  Blood Culture (routine x 2)     Status: None   Collection Time: 07/17/18  9:02 PM  Result Value Ref Range Status   Specimen Description BLOOD RIGHT ARM  Final   Special Requests   Final    BOTTLES DRAWN AEROBIC AND ANAEROBIC Blood Culture adequate volume   Culture   Final    NO GROWTH 5 DAYS Performed at  [facility], [address] [place], [place] [address]    Report Status 07/22/2018 FINAL  Final  Blood Culture (routine x 2)     Status: None   Collection Time: 07/17/18  9:05 PM  Result Value Ref Range Status   Specimen Description BLOOD LEFT ARM  Final   Special Requests   Final    BOTTLES DRAWN AEROBIC ONLY Blood Culture adequate volume   Culture   Final    NO GROWTH 5 DAYS Performed at [facility], [address] [place], [place] [address]    Report Status 07/22/2018 FINAL  Final  Surgical pcr screen     Status: Abnormal   Collection Time: 07/18/18  6:27 PM  Result Value Ref Range Status   MRSA, PCR NEGATIVE NEGATIVE Final   Staphylococcus aureus POSITIVE (A) NEGATIVE Final    Comment: (NOTE) The Xpert SA Assay (FDA approved for NASAL specimens in patients 46 years of age and older), is one component of a comprehensive surveillance program. It is not intended to diagnose infection nor to guide or monitor  treatment. Performed at [facility], [address] [place], [place] [address]          Radiology Studies: No results found.      Scheduled Meds: . Chlorhexidine Gluconate Cloth  6 each Topical Daily  . docusate sodium  100 mg Oral BID  . heparin  5,000 Units Subcutaneous Q8H  . insulin aspart  0-9 Units Subcutaneous TID AC & HS  . insulin glargine  10 Units Subcutaneous Daily  . mupirocin ointment  1 application Nasal BID  . polyethylene glycol  17 g Oral BID  . senna-docusate  1 tablet Oral BID   Continuous Infusions: . sodium chloride    . methocarbamol (ROBAXIN) IV    . metronidazole 500 mg (07/22/18 1006)  . vancomycin 1,000 mg (07/22/18 1214)     LOS: 4 days    Time spent: 35 minutes    Ramiro Harvestaniel Brandom Kerwin, MD [facility] Pager [phone]  If 7PM-7AM, please contact night-coverage [email or web address] Password [email or web address] 07/22/2018, 1:16 PM

## 2018-07-22 NOTE — Plan of Care (Signed)

## 2018-07-22 NOTE — Progress Notes (Signed)
Occupational Therapy Treatment Patient Details Name: Kurt Anderson MRN: [id number] DOB: [birth year] Today's Date: 07/22/2018    History of present illness Pt is a 47 y.o. M with significant PMH of diabetes mellitus type 2 who presents with abscess osteomyelitis and purulent drainage from Charcot foot on the right. Now s/p right transtibial amputation.   OT comments  Pt. Seen for skilled OT session.  Showed examples of A/E for LB ADLs.  Also reviewed tub bench for bathing needs. Pt. Verbalized interest.  Will plan to practice tub transfer with bench at next session.  Pt. Had just finished PT prior to my session and preferred seated session this day for A/E education.    Follow Up Recommendations  No OT follow up    Equipment Recommendations  Tub/shower bench;Other (comment)    Recommendations for Other Services      Precautions / Restrictions Precautions Precautions: Fall Precaution Comments: wound vac Required Braces or Orthoses: Other Brace/Splint Other Brace/Splint: right limb guard(positioning of residual limb reviewed with pt) Restrictions Weight Bearing Restrictions: Yes RLE Weight Bearing: Non weight bearing       Mobility Bed Mobility Overal bed mobility: Modified Independent             General bed mobility comments: pt. was seated in recliner for therapy session  Transfers Overall transfer level: Needs assistance Equipment used: Rolling walker (2 wheeled) Transfers: Sit to/from Stand Sit to Stand: Min guard         General transfer comment: safe hand placement demonstrated    Balance Overall balance assessment: Needs assistance Sitting-balance support: No upper extremity supported;Feet supported Sitting balance-Leahy Scale: Good     Standing balance support: Bilateral upper extremity supported Standing balance-Leahy Scale: Fair                             ADL either performed or assessed with clinical judgement   ADL Overall  ADL's : Needs assistance/impaired                     Lower Body Dressing: Set up;Sitting/lateral leans Lower Body Dressing Details (indicate cue type and reason): able to complete LB dressing socks and simulated review of pants with set up lateral leans, was not interested in A/E for LB dressing           Tub/Shower Transfer Details (indicate cue type and reason): reviewed tub bench pt. states he will need one and is interested in having one   General ADL Comments: Pt educated on use of tub bench with demo and with ADL A/E,      Vision       Perception     Praxis      Cognition Arousal/Alertness: Awake/alert Behavior During Therapy: WFL for tasks assessed/performed Overall Cognitive Status: Within Functional Limits for tasks assessed                                          Exercises     Shoulder Instructions       General Comments      Pertinent Vitals/ Pain       Pain Assessment: 0-10 Faces Pain Scale: Hurts a little bit Pain Location: right residual limb Pain Descriptors / Indicators: Operative site guarding Pain Intervention(s): Monitored during session;Repositioned  Home Living  Prior Functioning/Environment              Frequency  Min 2X/week        Progress Toward Goals  OT Goals(current goals can now be found in the care plan section)  Progress towards OT goals: Progressing toward goals  Acute Rehab OT Goals Patient Stated Goal: "get a prosthetic"  Plan      Co-evaluation                 AM-PAC PT "6 Clicks" Daily Activity     Outcome Measure   Help from another person eating meals?: None Help from another person taking care of personal grooming?: A Little Help from another person toileting, which includes using toliet, bedpan, or urinal?: A Lot Help from another person bathing (including washing, rinsing, drying)?: A Little Help from  another person to put on and taking off regular upper body clothing?: A Little Help from another person to put on and taking off regular lower body clothing?: A Lot 6 Click Score: 17    End of Session    OT Visit Diagnosis: Other abnormalities of gait and mobility (R26.89);Muscle weakness (generalized) (M62.81);Pain Pain - Right/Left: Right Pain - part of body: Leg   Activity Tolerance Patient tolerated treatment well   Patient Left in chair;with call bell/phone within reach   Nurse Communication          Time: 1610-9604 OT Time Calculation (min): 10 min  Charges: OT General Charges $OT Visit: 1 Visit OT Treatments $Self Care/Home Management : 8-22 mins  Robet Leu, COTA/L 07/22/2018, 12:32 PM

## 2018-07-22 NOTE — Progress Notes (Signed)
Physical Therapy Treatment Patient Details Name: Kurt Anderson MRN: [id number] DOB: 1971-07-19 Today's Date: 07/22/2018    History of Present Illness Pt is a 47 y.o. M with significant PMH of diabetes mellitus type 2 who presents with abscess osteomyelitis and purulent drainage from Charcot foot on the right. Now s/p right transtibial amputation.    PT Comments    Patient is progressing well toward PT goals. Pt tolerated increased gait distance and stair training with min guard assist for safety. Continue to progress as tolerated.    Follow Up Recommendations  No PT follow up     Equipment Recommendations  Rolling walker with 5" wheels    Recommendations for Other Services       Precautions / Restrictions Precautions Precautions: Fall Precaution Comments: wound vac Required Braces or Orthoses: Other Brace/Splint Other Brace/Splint: right limb guard(positioning of residual limb reviewed with pt) Restrictions Weight Bearing Restrictions: Yes RLE Weight Bearing: Non weight bearing    Mobility  Bed Mobility Overal bed mobility: Modified Independent                Transfers Overall transfer level: Needs assistance Equipment used: Rolling walker (2 wheeled) Transfers: Sit to/from Stand Sit to Stand: Min guard         General transfer comment: safe hand placement demonstrated  Ambulation/Gait Ambulation/Gait assistance: Min guard Gait Distance (Feet): 100 Feet Assistive device: Rolling walker (2 wheeled) Gait Pattern/deviations: Step-to pattern(hop to pattern ) Gait velocity: decr   General Gait Details: pt with safe use of AD and no LOB   Stairs Stairs: Yes Stairs assistance: Min guard Stair Management: Two rails;Forwards Number of Stairs: (2 steps, 3 steps, then 3 steps, 2 steps (in gym)) General stair comments: cues for safety; no physical assistance required; min guard for safety   Wheelchair Mobility    Modified Rankin (Stroke Patients  Only)       Balance Overall balance assessment: Needs assistance Sitting-balance support: No upper extremity supported;Feet supported Sitting balance-Leahy Scale: Good     Standing balance support: Bilateral upper extremity supported Standing balance-Leahy Scale: Fair                              Cognition Arousal/Alertness: Awake/alert Behavior During Therapy: WFL for tasks assessed/performed Overall Cognitive Status: Within Functional Limits for tasks assessed                                        Exercises      General Comments        Pertinent Vitals/Pain Pain Assessment: Faces Faces Pain Scale: Hurts a little bit Pain Location: right residual limb Pain Descriptors / Indicators: Operative site guarding Pain Intervention(s): Monitored during session;Repositioned    Home Living                      Prior Function            PT Goals (current goals can now be found in the care plan section) Acute Rehab PT Goals Patient Stated Goal: "get a prosthetic" PT Goal Formulation: With patient Time For Goal Achievement: 08/03/18 Potential to Achieve Goals: Good Progress towards PT goals: Progressing toward goals    Frequency    Min 5X/week      PT Plan Current plan remains appropriate    Co-evaluation  AM-PAC PT "6 Clicks" Daily Activity  Outcome Measure  Difficulty turning over in bed (including adjusting bedclothes, sheets and blankets)?: None Difficulty moving from lying on back to sitting on the side of the bed? : None Difficulty sitting down on and standing up from a chair with arms (e.g., wheelchair, bedside commode, etc,.)?: A Little Help needed moving to and from a bed to chair (including a wheelchair)?: A Little Help needed walking in hospital room?: A Little Help needed climbing 3-5 steps with a railing? : A Little 6 Click Score: 20    End of Session Equipment Utilized During Treatment:  Gait belt Activity Tolerance: Patient tolerated treatment well Patient left: with call bell/phone within reach;in chair Nurse Communication: Mobility status PT Visit Diagnosis: Unsteadiness on feet (R26.81);Difficulty in walking, not elsewhere classified (R26.2)     Time: 1028-1050 PT Time Calculation (min) (ACUTE ONLY): 22 min  Charges:  $Gait Training: 8-22 mins                     Erline LevineKellyn Christin Mccreedy, PTA Pager: [phone]     Carolynne EdouardKellyn R Zakiya Sporrer 07/22/2018, 11:07 AM

## 2018-07-22 NOTE — Care Management (Signed)
Pt verified that he needs a regular RW as he has been using in the room.   An order for a rollator was placed, but pt agrees that a RW, as recommended by PT, would better suit his needs.  Order changed as needed [facility]

## 2018-07-23 DIAGNOSIS — L03115 Cellulitis of right lower limb: Secondary | ICD-10-CM

## 2018-07-23 DIAGNOSIS — E871 Hypo-osmolality and hyponatremia: Secondary | ICD-10-CM

## 2018-07-23 DIAGNOSIS — Z89511 Acquired absence of right leg below knee: Secondary | ICD-10-CM

## 2018-07-23 DIAGNOSIS — R945 Abnormal results of liver function studies: Secondary | ICD-10-CM

## 2018-07-23 DIAGNOSIS — D509 Iron deficiency anemia, unspecified: Secondary | ICD-10-CM

## 2018-07-23 DIAGNOSIS — E1140 Type 2 diabetes mellitus with diabetic neuropathy, unspecified: Secondary | ICD-10-CM

## 2018-07-23 LAB — CBC WITH DIFFERENTIAL/PLATELET
ABS IMMATURE GRANULOCYTES: 0.2 10*3/uL — AB (ref 0.0–0.1)
BASOS ABS: 0.1 10*3/uL (ref 0.0–0.1)
BASOS PCT: 2 %
EOS PCT: 4 %
Eosinophils Absolute: 0.4 10*3/uL (ref 0.0–0.7)
HCT: 34.6 % — ABNORMAL LOW (ref 39.0–52.0)
HEMOGLOBIN: 10.7 g/dL — AB (ref 13.0–17.0)
Immature Granulocytes: 2 %
Lymphocytes Relative: 26 %
Lymphs Abs: 2.3 10*3/uL (ref 0.7–4.0)
MCH: 26.7 pg (ref 26.0–34.0)
MCHC: 30.9 g/dL (ref 30.0–36.0)
MCV: 86.3 fL (ref 78.0–100.0)
MONO ABS: 0.7 10*3/uL (ref 0.1–1.0)
MONOS PCT: 7 %
NEUTROS ABS: 5.2 10*3/uL (ref 1.7–7.7)
Neutrophils Relative %: 59 %
PLATELETS: 591 10*3/uL — AB (ref 150–400)
RBC: 4.01 MIL/uL — ABNORMAL LOW (ref 4.22–5.81)
RDW: 13.3 % (ref 11.5–15.5)
WBC: 8.8 10*3/uL (ref 4.0–10.5)

## 2018-07-23 LAB — BASIC METABOLIC PANEL
Anion gap: 9 (ref 5–15)
BUN: 10 mg/dL (ref 6–20)
CHLORIDE: 103 mmol/L (ref 98–111)
CO2: 25 mmol/L (ref 22–32)
CREATININE: 0.92 mg/dL (ref 0.61–1.24)
Calcium: 8.2 mg/dL — ABNORMAL LOW (ref 8.9–10.3)
GFR calc Af Amer: >60 mL/min (ref >60)
GFR calc non Af Amer: >60 mL/min (ref >60)
GLUCOSE: 172 mg/dL — AB (ref 70–99)
POTASSIUM: 4.1 mmol/L (ref 3.5–5.1)
Sodium: 137 mmol/L (ref 135–145)

## 2018-07-23 LAB — GLUCOSE, CAPILLARY
Glucose-Capillary: 159 mg/dL — ABNORMAL HIGH (ref 70–99)
Glucose-Capillary: 201 mg/dL — ABNORMAL HIGH (ref 70–99)

## 2018-07-23 MED ORDER — METFORMIN HCL 500 MG PO TABS: 500.0000 mg | ORAL_TABLET | Freq: Two times a day (BID) | ORAL | 0 refills | Status: AC

## 2018-07-23 MED ORDER — POLYETHYLENE GLYCOL 3350 17 G PO PACK: 17.0000 g | PACK | Freq: Every day | ORAL | 0 refills | Status: AC

## 2018-07-23 MED ORDER — SENNOSIDES-DOCUSATE SODIUM 8.6-50 MG PO TABS: 1.0000 | ORAL_TABLET | Freq: Two times a day (BID) | ORAL | Status: AC

## 2018-07-23 MED ORDER — FERROUS SULFATE 325 (65 FE) MG PO TABS: 325.0000 mg | ORAL_TABLET | Freq: Two times a day (BID) | ORAL | Status: DC

## 2018-07-23 MED ORDER — FERROUS SULFATE 325 (65 FE) MG PO TABS: 325.0000 mg | ORAL_TABLET | Freq: Two times a day (BID) | ORAL | 3 refills | Status: AC

## 2018-07-23 MED ORDER — HYDROCODONE-ACETAMINOPHEN 5-325 MG PO TABS: 1.0000 | ORAL_TABLET | Freq: Four times a day (QID) | ORAL | 0 refills | Status: AC | PRN

## 2018-07-23 MED ORDER — METHOCARBAMOL 500 MG PO TABS: 500.0000 mg | ORAL_TABLET | Freq: Four times a day (QID) | ORAL | 0 refills | Status: AC | PRN

## 2018-07-23 NOTE — Progress Notes (Signed)
Discharge instructions completed with pt.  Pt verbalized understanding of the information.  Pt denies chest pain, shortness of breath, dizziness, lightheadedness, and n/v.  Pt's IV d/c'ed.  Pt waiting on his ride to be discharged home.

## 2018-07-23 NOTE — Progress Notes (Signed)
Physical Therapy Treatment Patient Details Name: Kurt Anderson MRN: [id number] DOB: Jan 01, 1971 Today's Date: 07/23/2018    History of Present Illness Pt is a 47 y.o. M with significant PMH of diabetes mellitus type 2 who presents with abscess osteomyelitis and purulent drainage from Charcot foot on the right. Now s/p right transtibial amputation.    PT Comments     Pt presents in bed this session. Reports increased pain and requests to not get OOB. Pt plans to go home today and questions when that will occur. Advised pt that MD will follow-up with discharge instructions as appropriate. Reviewed supine exercises in HEP, pt performed with no issues noted.    Follow Up Recommendations  No PT follow up     Equipment Recommendations  Rolling walker with 5" wheels    Recommendations for Other Services       Precautions / Restrictions Precautions Precautions: Fall Precaution Comments: wound vac Required Braces or Orthoses: Other Brace/Splint Other Brace/Splint: right limb guard Restrictions Weight Bearing Restrictions: Yes RLE Weight Bearing: Non weight bearing    Mobility  Bed Mobility Overal bed mobility: Modified Independent             General bed mobility comments: preferred not to get OOB this session  Transfers Overall transfer level: Needs assistance Equipment used: Rolling walker (2 wheeled) Transfers: Sit to/from [employer]Stand;Stand Pivot Transfers Sit to Stand: Supervision Stand pivot transfers: Min guard       General transfer comment: preferred not to get OOB this session  Ambulation/Gait                 Stairs             Wheelchair Mobility    Modified Rankin (Stroke Patients Only)       Balance                                            Cognition Arousal/Alertness: Awake/alert Behavior During Therapy: WFL for tasks assessed/performed Overall Cognitive Status: Within Functional Limits for tasks assessed                                         Exercises Amputee Exercises Quad Sets: Right;Supine;10 reps Gluteal Sets: AROM;Both;10 reps;Supine Towel Squeeze: AROM;Right;10 reps;Supine Hip ABduction/ADduction: Right;Supine;10 reps Knee Flexion: AROM;Right;10 reps;Supine    General Comments        Pertinent Vitals/Pain Pain Assessment: 0-10 Pain Score: 5  Pain Location: right residual limb Pain Descriptors / Indicators: Operative site guarding Pain Intervention(s): Monitored during session;Premedicated before session;Repositioned    Home Living                      Prior Function            PT Goals (current goals can now be found in the care plan section) Acute Rehab PT Goals Patient Stated Goal: "get a prosthetic" Progress towards PT goals: Progressing toward goals    Frequency    Min 5X/week      PT Plan Current plan remains appropriate    Co-evaluation              AM-PAC PT "6 Clicks" Daily Activity  Outcome Measure  Difficulty turning over in bed (including adjusting bedclothes, sheets and blankets)?:  None Difficulty moving from lying on back to sitting on the side of the bed? : None Difficulty sitting down on and standing up from a chair with arms (e.g., wheelchair, bedside commode, etc,.)?: A Little Help needed moving to and from a bed to chair (including a wheelchair)?: A Little Help needed walking in hospital room?: A Little Help needed climbing 3-5 steps with a railing? : A Little 6 Click Score: 20    End of Session Equipment Utilized During Treatment: Gait belt Activity Tolerance: Patient tolerated treatment well Patient left: in bed;with call bell/phone within reach Nurse Communication: Mobility status PT Visit Diagnosis: Unsteadiness on feet (R26.81);Difficulty in walking, not elsewhere classified (R26.2)     Time: 1610-9604 PT Time Calculation (min) (ACUTE ONLY): 14 min  Charges:  $Therapeutic Exercise: 8-22  mins                     Colin Broach PT, DPT      Ruel Favors Aletha Halim 07/23/2018, 12:57 PM

## 2018-07-23 NOTE — Progress Notes (Signed)
Occupational Therapy Treatment Patient Details Name: Kurt CongGabriel Silva-Galvan MRN: [id number] DOB: 1971-06-02 Today's Date: 07/23/2018    History of present illness Pt is a 47 y.o. M with significant PMH of diabetes mellitus type 2 who presents with abscess osteomyelitis and purulent drainage from Charcot foot on the right. Now s/p right transtibial amputation.   OT comments  Pt. Able to return demo of tub transfer with bench.  Reports his brother will be with him during tub transfer for additional S/safety.  Progressing well.  Eager for home when able.    Follow Up Recommendations  No OT follow up    Equipment Recommendations  Tub/shower bench;Other (comment)    Recommendations for Other Services      Precautions / Restrictions Precautions Precautions: Fall Precaution Comments: wound vac Other Brace/Splint: right limb guard Restrictions Weight Bearing Restrictions: Yes RLE Weight Bearing: Non weight bearing       Mobility Bed Mobility Overal bed mobility: Modified Independent             General bed mobility comments: able to transfer in/out of bed hob flat, no rails without any physical assistance  Transfers Overall transfer level: Needs assistance Equipment used: Rolling walker (2 wheeled) Transfers: Sit to/from [employer]Stand;Stand Pivot Transfers Sit to Stand: Supervision Stand pivot transfers: Min guard       General transfer comment: safe hand placement demonstrated    Balance                                           ADL either performed or assessed with clinical judgement   ADL Overall ADL's : Needs assistance/impaired                                 Tub/ Shower Transfer: Tub transfer;Min guard;Cueing for sequencing;Tub bench;Rolling walker;Grab bars Tub/Shower Transfer Details (indicate cue type and reason): instruction for tub transfer,  reviewed not attempting bathing without brother present for the in/out of the tub safety.  he nodded and also verbalized understanding Functional mobility during ADLs: Min guard       Vision       Perception     Praxis      Cognition Arousal/Alertness: Awake/alert Behavior During Therapy: WFL for tasks assessed/performed Overall Cognitive Status: Within Functional Limits for tasks assessed                                          Exercises     Shoulder Instructions       General Comments      Pertinent Vitals/ Pain       Pain Assessment: 0-10 Pain Score: 6  Pain Location: right residual limb Pain Descriptors / Indicators: Operative site guarding  Home Living                                          Prior Functioning/Environment              Frequency  Min 2X/week        Progress Toward Goals  OT Goals(current goals can now be found in the care plan section)  Progress towards OT goals: Progressing toward goals     Plan      Co-evaluation                 AM-PAC PT "6 Clicks" Daily Activity     Outcome Measure   Help from another person eating meals?: None Help from another person taking care of personal grooming?: A Little Help from another person toileting, which includes using toliet, bedpan, or urinal?: A Lot Help from another person bathing (including washing, rinsing, drying)?: A Little Help from another person to put on and taking off regular upper body clothing?: A Little Help from another person to put on and taking off regular lower body clothing?: A Lot 6 Click Score: 17    End of Session Equipment Utilized During Treatment: Gait belt;Rolling walker  OT Visit Diagnosis: Other abnormalities of gait and mobility (R26.89);Muscle weakness (generalized) (M62.81);Pain Pain - Right/Left: Right Pain - part of body: Leg   Activity Tolerance Patient tolerated treatment well   Patient Left in bed;with call bell/phone within reach   Nurse Communication          Time: 9629-5284 OT  Time Calculation (min): 11 min  Charges: OT General Charges $OT Visit: 1 Visit OT Treatments $Self Care/Home Management : 8-22 mins   Robet Leu, COTA/L 07/23/2018, 10:08 AM

## 2018-07-23 NOTE — Discharge Summary (Signed)
Physician Discharge Summary  Kurt Anderson WUJ:[id number] DOB: [birth year] DOA: 07/17/2018  PCP: Kurt Man, MD  Admit date: 07/17/2018 Discharge date: 07/23/2018  Time spent: 55 minutes  Recommendations for Outpatient Follow-up:  1. Follow-up with Dr. Lajoyce Anderson in 1 week. 2. Follow-up with Kurt Man, MD in 2 weeks.  On follow-up patient will need a basic metabolic profile done to follow-up on electrolytes and renal function.  Patient has been started on metformin for his diabetes which will need to be followed up on.   Discharge Diagnoses:  Principal Problem:   Acute osteomyelitis of right foot (HCC) Active Problems:   Type 2 diabetes mellitus (HCC)   Anemia   Abnormal LFTs   Hyponatremia   Hypoalbuminemia   Proteinuria   Iron deficiency anemia   Discharge Condition: Stable and improved.  Diet recommendation: Carb modified diet.  Filed Weights   07/17/18 2038 07/17/18 2337 07/19/18 1436  Weight: 98.9 kg 98.9 kg 98.9 kg    History of present illness:  Per Dr.Ortiz Kurt Anderson is a 47 y.o. male with medical history significant of type 2 diabetes, right big toe amputation in 2018 who presented to the emergency department with complaints of developing a blister on his right sole about 3 days ago, which subsequently filled up with purulent, foul-smelling fluid, which has been draining for the past couple days.  He has developed another blister on the dorsal/lateral aspect of his right foot.  He denied trauma to the area.  He denied fever, chills, but feels fatigue and malaise.  No headache, sore throat, dyspnea, hemoptysis, wheezing, chest pain, palpitations, dizziness, diaphoresis, PND, orthopnea or pitting edema of the lower extremities.  Denied abdominal pain, nausea, emesis, diarrhea, constipation, melena or hematochezia.  Denied dysuria, frequency or hematuria.  Denied polyuria, polydipsia, polyphagia or blurred vision.  He stated that his blood glucose has been below  160s in the evenings, when he usually checks it.  Denied skin pruritus.  ED Course: His initial temperature was 101.3 F, pulse 108, respirations 22, blood pressure 99/67 mmHg and O2 sat 99% on room air.  He received a 2000 mL NS bolus, hydromorphone 1 mg IVP, thousand milligrams of vancomycin, 3.375 g of Zosyn and 600 mg of clindamycin IVPB.  I added Toradol 30 mg IVP x1 dose.  His urinalysis showed an amber color and proteinuria 100 mg/dL.  CBC showed a white count of 16.4 with 79% neutrophils, 11% lymphocytes and 8% monocytes.  Hemoglobin is 12.7 g/dL and platelets 956.  Lactic acid #1 was 1.45 and lactic acid number two 1.14, sodium 130, potassium 4.3, chloride 94, CO2 26 mmol S/L.  BUN was 22, creatinine 1.23, glucose 162, calcium 8.4 mg/dL.  Total protein 8.1, albumin 2.8 g/dL.  AST was 53, ALT 49 and alkaline phosphatase 150 units/L.  Total bilirubin was 1.5 mg/dL.  Imaging: Right foot x-ray showed marked soft tissue swelling of the right foot associated with plantar soft tissue ulceration measuring 2 cm x 0 point centimeters in depth at the level of the midfoot.  There was upon destruction and disorganization of the midfoot articulation with lateral subluxation of the third fifth, relative to the midfoot which could represent an estimate of Charcot neuropathy.  There is soft tissue emphysema noted on the foot and the possibility of necrotizing fasciitis x-rays.  Tolerated on irregularity along his medial aspect he suggests this on the oblique view.  These findings are better visualized on CT of the right lower extremity.  Please see  images and full radiology report for further detail.   Hospital Course:  1 right foot abscess/osteomyelitis with ulceration and purulent drainage Patient had presented with right foot abscess with purulent drainage imaging consistent with osteomyelitis.  Blood cultures obtained with no growth to date.  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  Patient was placed on admission on IV vancomycin, IV Rocephin and IV Flagyl.  Case was discussed with ID and was recommended patient continue IV antibiotics 48 hours postoperatively.  Patient received IV antibiotics 48 hours postop was in stable condition afebrile with clinical improvement.  Patient be discharged home in stable and improved condition will follow-up with orthopedics in the outpatient setting.   2.  Type 2 diabetes mellitus Hemoglobin A1c 7.4.    Patient was maintained on Lantus and sliding scale insulin during the hospitalization.  Patient will be discharged on metformin 500 mg twice daily with outpatient follow-up with PCP for further diabetes management.   3.  Iron deficiency anemia Patient with a hemoglobin which seemed to have stabilized at 9.5 from 9.5 from 9.9 from 12.7 on admission.  Likely dilutional.  No overt bleeding.Started on iron sulfate TID.    4.  Hyponatremia Likely secondary to hypovolemic hyponatremia.  Resolved with hydration.    5.  Abnormal liver function test LFTs trended down during the hospitalization and had resolved by day of discharge..  Acute hepatitis panel negative.  HIV nonreactive.  Follow.    6.  Hypoalbuminemia Likely multifactorial.  7.  Proteinuria Outpatient follow-up with PCP.  8.  Nausea/emesis likely secondary to constipation Likely secondary to constipation.  Clinical improvement after soapsuds enema with good bowel movement.  Patient placed on a bowel regimen of MiraLAX and Senokot which he will be discharged home on.   Procedures:  ABI with TBI 07/18/2018  Plain films of the right foot 07/17/2018  CT right lower extremity 07/17/2018  Right upper quadrant ultrasound 07/18/2018  Transtibial amputation, application of Praveena wound VAC per Dr. Lajoyce Anderson 07/19/2018  Consultations:  Orthopedics: Dr. Lajoyce Anderson 07/18/2018  Orthopedics: Dr. Eulah Anderson 07/18/2018  Wound care Kurt Mcgee, RN 07/18/2018  Discharge  Exam: Vitals:   07/23/18 0400 07/23/18 1319  BP: 132/78 127/84  Pulse: 65 74  Resp:  16  Temp: 98.2 F (36.8 C) 98.9 F (37.2 C)  SpO2: 99% 100%    General: NAD Cardiovascular: RRR Respiratory: CTAB  Discharge Instructions   Discharge Instructions    Diet Carb Modified   Complete by:  As directed    Increase activity slowly   Complete by:  As directed    Negative Pressure Wound Therapy - Incisional   Complete by:  As directed      Allergies as of 07/23/2018   No Known Allergies     Medication List    TAKE these medications   ferrous sulfate 325 (65 FE) MG tablet Take 1 tablet (325 mg total) by mouth 2 (two) times daily with a meal.   HYDROcodone-acetaminophen 5-325 MG tablet Commonly known as:  NORCO/VICODIN Take 1-2 tablets by mouth every 6 (six) hours as needed for moderate pain.   metFORMIN 500 MG tablet Commonly known as:  GLUCOPHAGE Take 1 tablet (500 mg total) by mouth 2 (two) times daily with a meal.   methocarbamol 500 MG tablet Commonly known as:  ROBAXIN Take 1 tablet (500 mg total) by mouth every 6 (six) hours as needed for muscle spasms.   polyethylene glycol packet Commonly known as:  MIRALAX / GLYCOLAX Take 17 g by mouth daily.   senna-docusate 8.6-50 MG tablet Commonly known as:  Senokot-S Take 1 tablet by mouth 2 (two) times daily.            Durable Medical Equipment  (From admission, onward)         Start     Ordered   07/22/18 1308  For home use only DME Tub bench  Once     07/22/18 1307   07/22/18 1150  For home use only DME Walker rolling  Once    Question:  Patient needs a walker to treat with the following condition  Answer:  Weakness   07/22/18 1150         No Known Allergies Follow-up Information    Nadara Mustard, MD In 1 week.   Specialty:  Orthopedic Surgery Contact information: [address] [place] [address] [phone]        Kurt Man, MD. Schedule an appointment as soon as  possible for a visit in 2 week(s).   Specialty:  Family Medicine Why:  f/u in 2 weeks. Contact information: [address] [place] [address] [phone]            The results of significant diagnostics from this hospitalization (including imaging, microbiology, ancillary and laboratory) are listed below for reference.    Significant Diagnostic Studies: Dg Foot Complete Right  Result Date: 07/17/2018 CLINICAL DATA:  Infected blister along the plantar aspect of the right foot. History of diabetes. Code sepsis. EXAM: RIGHT FOOT COMPLETE - 3+ VIEW COMPARISON:  None. FINDINGS: Soft tissues: There is marked soft tissue swelling of the included ankle and more so of the midfoot where there are scattered areas of subcutaneous soft tissue emphysema noted along the dorsum, medial and lateral aspect of the mid and forefoot. Soft tissue ulceration is seen along the plantar surface of the midfoot measuring approximately 2 cm in length and 6 mm deep. Forefoot: There has been amputation of the first ray at the base of the first metatarsal. The second through fourth distal and middle phalanges are intact. Osteoarthritic and erosive change of the second metatarsal head at the second MTP joint is identified with bony spurring at the base of the second proximal phalanx. The third through fifth MTP joints are intact. Midfoot: Bony destruction and disorganization of the midfoot is noted more markedly affecting the middle and lateral cuneiform with subchondral erosive change at the base of the medial cuneiform, tarsal navicular and cuboid. Laterally subluxed appearance of the third through fifth rays with widening between the second and third as well as first and second metatarsals is noted. Findings likely represent stigmata of Charcot neuropathy the presence of soft tissue swelling and emphysema raise concern for superimposed osteomyelitis of the midfoot more so involving the base of the second through fifth  metatarsals with bone destruction seen as well as of the middle cuneiform. Hindfoot: Prominent plantar calcaneal enthesophyte. Slight cortical irregularity along the medial talar dome may be secondary to osteoarthritis though the possibility of osteochondritis dissecans or other transchondral injury of the talar dome is not entirely excluded. IMPRESSION: 1. Marked soft tissue swelling of the included ankle and more so of the midfoot with associated plantar soft tissue ulceration measuring 2 cm in length by 0.6 cm in depth at the level of the midfoot. 2. Bone destruction and disorganization of the midfoot articulation with lateral subluxation of the third fifth rays relative to the midfoot likely  represent stigmata of Charcot neuropathy. However, superimposed on neuropathy is what appear be areas of irregular bone destruction specially involving the base of the second through fifth metatarsals raising concern for osteomyelitis. 3. Soft tissue emphysema is noted of the foot and the possibility of necrotizing fasciitis is also raised. 4. Talar dome irregularity along its medial aspect is suggested on the oblique view. Osteochondral defect might have this appearance or change secondary to advanced osteoarthritis across the tibiotalar joint. Electronically Signed   By: Tollie Eth M.D.   On: 07/17/2018 21:54   Ct Extremity Lower Right W Contrast  Result Date: 07/18/2018 CLINICAL DATA:  Acute onset of right plantar foot wound, with swelling and drainage. Abnormal radiographs. Further evaluation requested. EXAM: CT OF THE LOWER RIGHT EXTREMITY WITH CONTRAST TECHNIQUE: Multidetector CT imaging of the lower right extremity was performed according to the standard protocol following intravenous contrast administration. COMPARISON:  Right foot radiographs performed earlier today at 9:30 p.m. CONTRAST:  ISOVUE-300 IOPAMIDOL (ISOVUE-300) INJECTION 61% FINDINGS: Bones/Joint/Cartilage There is diffuse bony destruction  involving the midfoot, with callus formation about the bases of the metatarsals and diffuse osseous erosions, compatible with osteomyelitis. Air is noted tracking into much of the cuboid. Underlying changes of Charcot joint are again suggested. Associated soft tissue air tracks about the midfoot and forefoot, and into the distal second metatarsal. Soft tissue air tracks proximally about the distal tibia and lateral to the proximal fibula, with mild soft tissue air noted overlying the medial gastrocnemius musculature just below the level of the knee. Trace associated fluid is noted. This is suspicious for diffuse infection with a gas producing organism; necrotizing fasciitis is a concern. No well-defined joint effusion is identified, though extensive phlegmon and fluid are noted about the midfoot. Ligaments Suboptimally assessed by CT. Muscles and Tendons Not well assessed at the midfoot due to the extent of soft tissue inflammation. The visualized musculature and tendons are grossly unremarkable. Soft tissues A soft tissue laceration is noted along the plantar aspect of the foot. Extensive soft tissue swelling is noted about the midfoot and forefoot, with scattered soft tissue air as described above. Vague intramuscular edema tracks along the right lower leg, corresponding to scattered soft tissue air and fluid as described above. IMPRESSION: 1. Diffuse bony destruction involving the midfoot, with callus formation about the bases of the metatarsals and diffuse osseous erosions, compatible with osteomyelitis superimposed on changes of Charcot joint. Air tracks about the midfoot and forefoot, and into the distal second metatarsal. 2. Soft tissue air tracks proximally about the distal tibia and lateral to the proximal fibula, with mild soft tissue air overlying the medial gastrocnemius musculature just below the level of the knee. Trace associated fluid noted. This is suspicious for diffuse infection with a gas  producing organism. Necrotizing fasciitis is a concern. 3. Vague intramuscular edema tracks along the right lower leg, corresponding to scattered soft tissue air and fluid described above. 4. Soft tissue laceration along the plantar aspect of the foot. These results were called by telephone at the time of interpretation on 07/18/2018 at 12:10 am to Dr. Marily Memos, who verbally acknowledged these results. Electronically Signed   By: Roanna Raider M.D.   On: 07/18/2018 00:11   US Abdomen Limited Ruq  Result Date: 07/18/2018 CLINICAL DATA:  47 year old male with abnormal LFTs. Initial encounter. EXAM: ULTRASOUND ABDOMEN LIMITED RIGHT UPPER QUADRANT COMPARISON:  05/31/2012 CT. FINDINGS: Gallbladder: Tumefactive sludge. No gallbladder wall thickening. Patient not tender over the gallbladder during  scanning per sonographer. Common bile duct: Diameter: 3.4 mm proximally. Distal aspect not visualized secondary to bowel gas. Liver: Liver of increased echogenicity consistent with fatty infiltration and/or hepatocellular disease. No focal hepatic lesion. Portal vein is patent on color Doppler imaging with normal direction of blood flow towards the liver. IMPRESSION: 1. Tumefactive gallbladder sludge. No sonographic evidence of gallbladder inflammation. 2. Liver of increased echogenicity consistent with fatty infiltration and/or hepatocellular disease. No focal hepatic lesion noted. Electronically Signed   By: Lacy DuverneySteven  Olson M.D.   On: 07/18/2018 07:08    Microbiology: Recent Results (from the past 240 hour(s))  Blood Culture (routine x 2)     Status: None   Collection Time: 07/17/18  9:02 PM  Result Value Ref Range Status   Specimen Description BLOOD RIGHT ARM  Final   Special Requests   Final    BOTTLES DRAWN AEROBIC AND ANAEROBIC Blood Culture adequate volume   Culture   Final    NO GROWTH 5 DAYS Performed at [facility], [address] [place], [place] [address]    Report Status 07/22/2018 FINAL  Final   Blood Culture (routine x 2)     Status: None   Collection Time: 07/17/18  9:05 PM  Result Value Ref Range Status   Specimen Description BLOOD LEFT ARM  Final   Special Requests   Final    BOTTLES DRAWN AEROBIC ONLY Blood Culture adequate volume   Culture   Final    NO GROWTH 5 DAYS Performed at [facility], [address] [place], [place] [address]    Report Status 07/22/2018 FINAL  Final  Surgical pcr screen     Status: Abnormal   Collection Time: 07/18/18  6:27 PM  Result Value Ref Range Status   MRSA, PCR NEGATIVE NEGATIVE Final   Staphylococcus aureus POSITIVE (A) NEGATIVE Final    Comment: (NOTE) The Xpert SA Assay (FDA approved for NASAL specimens in patients 46 years of age and older), is one component of a comprehensive surveillance program. It is not intended to diagnose infection nor to guide or monitor treatment. Performed at [facility], [address] [place], [place] [address]      Labs: Basic Metabolic Panel: Recent Labs  Lab 07/19/18 0421 07/20/18 0339 07/21/18 0311 07/22/18 0354 07/23/18 0425  NA 132* 136 136 138 137  K 4.0 4.2 4.1 3.7 4.1  CL 99 101 101 103 103  CO2 23 26 25 27 25   GLUCOSE 213* 201* 238* 159* 172*  BUN 12 9 7 8 10   CREATININE 1.00 0.83 0.81 0.87 0.92  CALCIUM 7.6* 7.8* 7.7* 7.9* 8.2*  MG  --   --   --  2.0  --    Liver Function Tests: Recent Labs  Lab 07/17/18 2102 07/19/18 0421 07/20/18 0339  AST 53* 23 27  ALT 49* 26 24  ALKPHOS 151* 109 101  BILITOT 1.5* 0.8 0.8  PROT 8.1 6.0* 5.8*  ALBUMIN 2.8* 2.1* 2.0*   No results for input(s): LIPASE, AMYLASE in the last 168 hours. No results for input(s): AMMONIA in the last 168 hours. CBC: Recent Labs  Lab 07/19/18 0421 07/20/18 0339 07/21/18 0311 07/22/18 0354 07/23/18 0425  WBC 10.6* 8.8 7.8 8.5 8.8  NEUTROABS 7.8* 6.4 5.1 5.2 5.2  HGB 9.9* 9.5* 9.5* 9.9* 10.7*  HCT 30.8* 29.5* 30.6* 31.6* 34.6*  MCV 85.3 85.0 85.7 85.6 86.3  PLT 366 412* 451* 527* 591*    Cardiac Enzymes: No results for  input(s): CKTOTAL, CKMB, CKMBINDEX, TROPONINI in the last 168 hours. BNP: BNP (last 3 results) No results for input(s): BNP in the last 8760 hours.  ProBNP (last 3 results) No results for input(s): PROBNP in the last 8760 hours.  CBG: Recent Labs  Lab 07/22/18 1131 07/22/18 1616 07/22/18 2126 07/23/18 0608 07/23/18 1143  GLUCAP 189* 189* 173* 159* 201*       Signed:  Ramiro Harvest MD.  [facility] 07/23/2018, 2:23 PM

## 2018-07-24 DIAGNOSIS — Z89511 Acquired absence of right leg below knee: Secondary | ICD-10-CM

## 2018-07-24 DIAGNOSIS — L03115 Cellulitis of right lower limb: Secondary | ICD-10-CM

## 2018-07-31 ENCOUNTER — Encounter (INDEPENDENT_AMBULATORY_CARE_PROVIDER_SITE_OTHER): Payer: Self-pay | Admitting: Orthopedic Surgery

## 2018-07-31 ENCOUNTER — Ambulatory Visit (INDEPENDENT_AMBULATORY_CARE_PROVIDER_SITE_OTHER): Payer: Self-pay | Admitting: Orthopedic Surgery

## 2018-07-31 VITALS — Ht 67.0 in | Wt 218.0 lb

## 2018-07-31 DIAGNOSIS — Z89511 Acquired absence of right leg below knee: Secondary | ICD-10-CM

## 2018-07-31 MED ORDER — TRAMADOL HCL 50 MG PO TABS: 50.0000 mg | ORAL_TABLET | Freq: Four times a day (QID) | ORAL | 0 refills | Status: AC | PRN

## 2018-07-31 NOTE — Progress Notes (Signed)
Office Visit Note   Patient: Kurt Anderson           Date of Birth: Jul 02, 1971           MRN: [id number] Visit Date: 07/31/2018              Requested by: Ardyth ManPark, Chan M, MD [address] [place], [place] [id number] PCP: Ardyth ManPark, Chan M, MD  Chief Complaint  Patient presents with  . Right Leg - Routine Post Op    07/19/18 BKA      HPI: Patient is a 47 year old gentleman who presents in follow-up status post right transtibial amputation he is 2 weeks out.  The wound VAC was leaking.  Assessment & Plan: Visit Diagnoses:  1. Hx of BKA, right (HCC)     Plan: Patient will start washing with soap and water daily apply 4 x 4 Ace wrap in his stump shrinker.  Patient will continue using the limb guard from [employer].  Part of his staples at follow-up in 1 week.  Follow-Up Instructions: Return in about 1 week (around 08/07/2018).   Ortho Exam  Patient is alert, oriented, no adenopathy, well-dressed, normal affect, normal respiratory effort. Examination patient has significant decrease swelling there was no drainage in the wound VAC canister.  Patient does have no hair growth where the hydrocolloid was against the skin.  Imaging: No results found.    Labs: Lab Results  Component Value Date   HGBA1C 7.4 (H) 07/17/2018   CRP 28.0 (H) 07/17/2018   REPTSTATUS 07/22/2018 FINAL 07/17/2018   CULT  07/17/2018    NO GROWTH 5 DAYS Performed at [facility], [address] [place], [place] [address]    [facility] 05/31/2012     Lab Results  Component Value Date   ALBUMIN 2.0 (L) 07/20/2018   ALBUMIN 2.1 (L) 07/19/2018   ALBUMIN 2.8 (L) 07/17/2018    Body mass index is 34.14 kg/m.  Orders:  No orders of the defined types were placed in this encounter.  Meds ordered this encounter  Medications  . traMADol (ULTRAM) 50 MG tablet    Sig: Take 1 tablet (50 mg total) by mouth every 6 (six) hours as needed for moderate pain.    Dispense:  30 tablet   Refill:  0     Procedures: No procedures performed  Clinical Data: No additional findings.  ROS:  All other systems negative, except as noted in the HPI. Review of Systems  Objective: Vital Signs: Ht 5\' 7"  (1.702 m)   Wt 218 lb (98.9 kg)   BMI 34.14 kg/m   Specialty Comments:  No specialty comments available.  PMFS History: Patient Active Problem List   Diagnosis Date Noted  . Cellulitis of right lower extremity   . Hx of BKA, right (HCC)   . Iron deficiency anemia 07/19/2018  . Acute osteomyelitis of right foot (HCC) 07/18/2018  . Type 2 diabetes mellitus (HCC) 07/18/2018  . Anemia 07/18/2018  . Abnormal LFTs 07/18/2018  . Hyponatremia 07/18/2018  . Hypoalbuminemia 07/18/2018  . Proteinuria 07/18/2018   Past Medical History:  Diagnosis Date  . Type 2 diabetes mellitus (HCC)     Family History  Problem Relation Age of Onset  . Hypertension Mother     Past Surgical History:  Procedure Laterality Date  . AMPUTATION Right 07/19/2018   Procedure: AMPUTATION BELOW KNEE;  Surgeon: Nadara Mustarduda, Kaydence Menard V, MD;  [facility];  Service: Orthopedics;  Laterality: Right;  . AMPUTATION TOE Right 2018  Right big toe amputation.   Social History   Occupational History  . Not on file  Tobacco Use  . Smoking status: Never Smoker  . Smokeless tobacco: Never Used  Substance and Sexual Activity  . Alcohol use: No  . Drug use: No  . Sexual activity: Not on file

## 2018-08-08 ENCOUNTER — Ambulatory Visit (INDEPENDENT_AMBULATORY_CARE_PROVIDER_SITE_OTHER): Payer: Self-pay | Admitting: Orthopedic Surgery

## 2018-08-08 ENCOUNTER — Encounter (INDEPENDENT_AMBULATORY_CARE_PROVIDER_SITE_OTHER): Payer: Self-pay | Admitting: Orthopedic Surgery

## 2018-08-08 DIAGNOSIS — Z89511 Acquired absence of right leg below knee: Secondary | ICD-10-CM

## 2018-08-08 NOTE — Progress Notes (Signed)
Office Visit Note   Patient: Kurt Anderson           Date of Birth: 1971/07/31           MRN: [id number] Visit Date: 08/08/2018              Requested by: Ardyth ManPark, Chan M, MD [address] [place], [place] [id number] PCP: Ardyth ManPark, Chan M, MD  Chief Complaint  Patient presents with  . Right Leg - Routine Post Op      HPI: Patient is a 47 year old gentleman who is 3 weeks status post right transtibial amputation he is currently wearing a stump shrinker from [employer].  Assessment & Plan: Visit Diagnoses:  1. Hx of BKA, right (HCC)     Plan: The staples are harvested recommended scar massage with lotion recommended wearing the stump shrinker around-the-clock.  He was given a prescription for [employer] for a K3 level prosthesis.  Patient has the strength to use a K3 level prosthesis he has the desire and will require the ability to walk on uneven train will walk a different cadence and be able to go up and down bleachers stairs and ladders.  Follow-Up Instructions: Return in about 4 weeks (around 09/05/2018).   Ortho Exam  Patient is alert, oriented, no adenopathy, well-dressed, normal affect, normal respiratory effort. Examination patient has a well-healing residual limb there is no redness no cellulitis no drainage no ischemic changes no signs of infection.  We will harvest the staples today  Imaging: No results found. No images are attached to the encounter.  Labs: Lab Results  Component Value Date   HGBA1C 7.4 (H) 07/17/2018   CRP 28.0 (H) 07/17/2018   REPTSTATUS 07/22/2018 FINAL 07/17/2018   CULT  07/17/2018    NO GROWTH 5 DAYS Performed at [facility], [address] [place], [place] [id number]    [facility] 05/[facility]/2012     Lab Results  Component Value Date   ALBUMIN 2.0 (L) 07/20/2018   ALBUMIN 2.1 (L) 07/19/2018   ALBUMIN 2.8 (L) 07/17/2018    There is no height or weight on file to calculate BMI.  Orders:  No orders of the defined  types were placed in this encounter.  No orders of the defined types were placed in this encounter.    Procedures: No procedures performed  Clinical Data: No additional findings.  ROS:  All other systems negative, except as noted in the HPI. Review of Systems  Objective: Vital Signs: There were no vitals taken for this visit.  Specialty Comments:  No specialty comments available.  PMFS History: Patient Active Problem List   Diagnosis Date Noted  . Cellulitis of right lower extremity   . Hx of BKA, right (HCC)   . Iron deficiency anemia 07/19/2018  . Acute osteomyelitis of right foot (HCC) 07/18/2018  . Type 2 diabetes mellitus (HCC) 07/18/2018  . Anemia 07/18/2018  . Abnormal LFTs 07/18/2018  . Hyponatremia 07/18/2018  . Hypoalbuminemia 07/18/2018  . Proteinuria 07/18/2018   Past Medical History:  Diagnosis Date  . Type 2 diabetes mellitus (HCC)     Family History  Problem Relation Age of Onset  . Hypertension Mother     Past Surgical History:  Procedure Laterality Date  . AMPUTATION Right 07/19/2018   Procedure: AMPUTATION BELOW KNEE;  Surgeon: Nadara Mustarduda, Tamerra Merkley V, MD;  [facility];  Service: Orthopedics;  Laterality: Right;  . AMPUTATION TOE Right 2018   Right big toe amputation.   Social History  Occupational History  . Not on file  Tobacco Use  . Smoking status: Never Smoker  . Smokeless tobacco: Never Used  Substance and Sexual Activity  . Alcohol use: No  . Drug use: No  . Sexual activity: Not on file

## 2018-08-10 ENCOUNTER — Other Ambulatory Visit: Payer: Self-pay

## 2018-08-10 ENCOUNTER — Encounter (HOSPITAL_COMMUNITY): Payer: Self-pay | Admitting: Emergency Medicine

## 2018-08-10 ENCOUNTER — Emergency Department (HOSPITAL_COMMUNITY)
Admission: EM | Admit: 2018-08-10 | Discharge: 2018-08-10 | Disposition: A | Discharge status: Home / Self Care | Payer: Self-pay | Attending: Emergency Medicine | Admitting: Emergency Medicine

## 2018-08-10 DIAGNOSIS — Z89511 Acquired absence of right leg below knee: Secondary | ICD-10-CM | POA: Insufficient documentation

## 2018-08-10 DIAGNOSIS — Z7984 Long term (current) use of oral hypoglycemic drugs: Secondary | ICD-10-CM | POA: Insufficient documentation

## 2018-08-10 DIAGNOSIS — E119 Type 2 diabetes mellitus without complications: Secondary | ICD-10-CM | POA: Insufficient documentation

## 2018-08-10 DIAGNOSIS — Y658 Other specified misadventures during surgical and medical care: Secondary | ICD-10-CM | POA: Insufficient documentation

## 2018-08-10 DIAGNOSIS — T8131XA Disruption of external operation (surgical) wound, not elsewhere classified, initial encounter: Secondary | ICD-10-CM | POA: Insufficient documentation

## 2018-08-10 LAB — CBG MONITORING, ED: Glucose-Capillary: 125 mg/dL — ABNORMAL HIGH (ref 70–99)

## 2018-08-10 MED ORDER — CEPHALEXIN 500 MG PO CAPS: 500.0000 mg | ORAL_CAPSULE | Freq: Three times a day (TID) | ORAL | 0 refills | Status: AC

## 2018-08-10 MED ORDER — OXYCODONE-ACETAMINOPHEN 5-325 MG PO TABS
1.0000 | ORAL_TABLET | Freq: Once | ORAL | Status: AC
  Administered 2018-08-10: 1 via ORAL
  Filled 2018-08-10: qty 1

## 2018-08-10 MED ORDER — DOUBLE ANTIBIOTIC 500-10000 UNIT/GM EX OINT
TOPICAL_OINTMENT | Freq: Once | CUTANEOUS | Status: AC
  Administered 2018-08-10: 1 via TOPICAL
  Filled 2018-08-10: qty 1

## 2018-08-10 MED ORDER — CEPHALEXIN 500 MG PO CAPS
500.0000 mg | ORAL_CAPSULE | Freq: Once | ORAL | Status: AC
  Administered 2018-08-10: 500 mg via ORAL
  Filled 2018-08-10: qty 1

## 2018-08-10 NOTE — ED Provider Notes (Signed)
[facility] EMERGENCY DEPARTMENT Provider Note   CSN: [id number] Arrival date & time: 08/10/18  1700     History   Chief Complaint Chief Complaint  Patient presents with  . Fall    HPI Kurt Anderson is a 47 y.o. male.  HPI   47 year old male presenting for wound evaluation after mechanical fall earlier today.  He is status post BKA done on 8/7.  He has been healing well and had his sutures removed in the office 2 days ago.  When he fell today he dehisced the central portion of his wound.   Past Medical History:  Diagnosis Date  . Type 2 diabetes mellitus [facility])     Patient Active Problem List   Diagnosis Date Noted  . Cellulitis of right lower extremity   . Hx of BKA, right (HCC)   . Iron deficiency anemia 07/19/2018  . Acute osteomyelitis of right foot (HCC) 07/18/2018  . Type 2 diabetes mellitus (HCC) 07/18/2018  . Anemia 07/18/2018  . Abnormal LFTs 07/18/2018  . Hyponatremia 07/18/2018  . Hypoalbuminemia 07/18/2018  . Proteinuria 07/18/2018    Past Surgical History:  Procedure Laterality Date  . AMPUTATION Right 07/19/2018   Procedure: AMPUTATION BELOW KNEE;  Surgeon: Nadara Mustard, MD;  Location: [facility] OR;  Service: Orthopedics;  Laterality: Right;  . AMPUTATION TOE Right 2018   Right big toe amputation.        Home Medications    Prior to Admission medications   Medication Sig Start Date End Date Taking? Authorizing Provider  ferrous sulfate 325 (65 FE) MG tablet Take 1 tablet (325 mg total) by mouth 2 (two) times daily with a meal. 07/23/18   Rodolph Bong, MD  HYDROcodone-acetaminophen (NORCO/VICODIN) 5-325 MG tablet Take 1-2 tablets by mouth every 6 (six) hours as needed for moderate pain. 07/23/18   Rodolph Bong, MD  metFORMIN (GLUCOPHAGE) 500 MG tablet Take 1 tablet (500 mg total) by mouth 2 (two) times daily with a meal. 07/23/18   Rodolph Bong, MD  methocarbamol (ROBAXIN) 500 MG tablet Take 1 tablet (500 mg total) by mouth every  6 (six) hours as needed for muscle spasms. 07/23/18   Rodolph Bong, MD  polyethylene glycol [facility] Ethelene Hal) packet Take 17 g by mouth daily. 07/23/18   Rodolph Bong, MD  senna-docusate (SENOKOT-S) 8.6-50 MG tablet Take 1 tablet by mouth 2 (two) times daily. 07/23/18   Rodolph Bong, MD  traMADol (ULTRAM) 50 MG tablet Take 1 tablet (50 mg total) by mouth every 6 (six) hours as needed for moderate pain. 07/31/18   Nadara Mustard, MD    Family History Family History  Problem Relation Age of Onset  . Hypertension Mother     Social History Social History   Tobacco Use  . Smoking status: Never Smoker  . Smokeless tobacco: Never Used  Substance Use Topics  . Alcohol use: No  . Drug use: No     Allergies   Patient has no known allergies.   Review of Systems Review of Systems  All systems reviewed and negative, other than as noted in HPI.  Physical Exam Updated Vital Signs BP 115/73 (BP Location: Right Arm)   Pulse 93   Temp 98.5 F (36.9 C) (Oral)   Resp 17   SpO2 100%   Physical Exam  Constitutional: He appears well-developed and well-nourished. No distress.  HENT:  Head: Normocephalic and atraumatic.  Eyes: Conjunctivae are normal. Right eye exhibits no  discharge. Left eye exhibits no discharge.  Neck: Neck supple.  Cardiovascular: Normal rate, regular rhythm and normal heart sounds. Exam reveals no gallop and no friction rub.  No murmur heard. Pulmonary/Chest: Effort normal and breath sounds normal. No respiratory distress.  Abdominal: Soft. He exhibits no distension. There is no tenderness.  Musculoskeletal: He exhibits no edema or tenderness.  Right BKA.  Mild dehiscence of surgical wound as pictured.  Neurological: He is alert.  Skin: Skin is warm and dry.  Psychiatric: He has a normal mood and affect. His behavior is normal. Thought content normal.  Nursing note and vitals reviewed.      ED Treatments / Results  Labs (all labs ordered  are listed, but only abnormal results are displayed) Labs Reviewed  CBG MONITORING, ED - Abnormal; Notable for the following components:      Result Value   Glucose-Capillary 125 (*)    All other components within normal limits    EKG None  Radiology No results found.  Procedures Procedures (including critical care time)  Medications Ordered in ED Medications - No data to display   Initial Impression / Assessment and Plan / ED Course  I have reviewed the triage vital signs and the nursing notes.  Pertinent labs & imaging results that were available during my care of the patient were reviewed by me and considered in my medical decision making (see chart for details).    I have reviewed the triage vital signs and the nursing notes. Prior records were reviewed for additional information.    Pertinent labs & imaging results that were available during my care of the patient were reviewed by me and considered in my medical decision making (see chart for details).  47 year old male with dehiscence of surgical wound after mechanical fall. Rationale for not closing was explained. Wound cleaned and dressed.  Will put on antibiotics for a few days.   Continued wound care instructions were discussed.  Follow back up with Dr. Lajoyce Cornersuda.   Final Clinical Impressions(s) / ED Diagnoses   Final diagnoses:  Wound dehiscence, surgical, initial encounter    ED Discharge Orders    None       Raeford RazorKohut, Wylee Dorantes, MD 08/10/18 1747

## 2018-08-10 NOTE — ED Triage Notes (Signed)
Pt had rbk amputation and had stitches removed 2 days ago. Pt fell onto right knee today and family states area is opened again. Nad. Pt c/o pain to that area. denies hitting head

## 2018-08-16 ENCOUNTER — Ambulatory Visit (INDEPENDENT_AMBULATORY_CARE_PROVIDER_SITE_OTHER): Payer: Self-pay | Admitting: Physician Assistant

## 2018-08-16 ENCOUNTER — Encounter (INDEPENDENT_AMBULATORY_CARE_PROVIDER_SITE_OTHER): Payer: Self-pay | Admitting: Physician Assistant

## 2018-08-16 VITALS — Ht 67.0 in | Wt 218.0 lb

## 2018-08-16 DIAGNOSIS — Z89611 Acquired absence of right leg above knee: Secondary | ICD-10-CM

## 2018-08-16 NOTE — Progress Notes (Signed)
Office Visit Note   Patient: Kurt Anderson           Date of Birth: [birth year]           MRN: [id number] Visit Date: 08/16/2018              Requested by: Ardyth Man, MD [address] [place], [place] [address] PCP: Ardyth Man, MD  No chief complaint on file.     HPI: Patient is a 47 year old male who is seen for postoperative follow-up following a right transtibial amputation.  He had been doing well and then he fell on 08/10/2018 landing directly on his transtibial amputation stump.  He seen in follow-up following this injury.  He was seen in the emergency room following a fall and started on some Keflex and instructed to follow-up in the office.  Assessment & Plan: Visit Diagnoses: No diagnosis found.  Plan: He is going to use soap and water to the transtibial amputation and antibiotic ointment if he likes to the open area and use his stump shrinker stocking.  He has not gone to [employer] yet.  He will follow-up here in approximately 3 weeks for follow-up.  He could complete his course of Keflex.  Follow-Up Instructions: No follow-ups on file.   Ortho Exam  Patient is alert, oriented, no adenopathy, well-dressed, normal affect, normal respiratory effort. The right transtibial amputation incision has a 3 x 1 cm superficial open area which has pink bleeding granulation tissue present.  There are no signs of cellulitis.  There is some edema over the distal stump.  He has a dog ear over the right lateral incision line with very superficial dehiscence.  And again no signs of cellulitis here.  Imaging: No results found. No images are attached to the encounter.  Labs: Lab Results  Component Value Date   HGBA1C 7.4 (H) 07/17/2018   CRP 28.0 (H) 07/17/2018   REPTSTATUS 07/22/2018 FINAL 07/17/2018   CULT  07/17/2018    NO GROWTH 5 DAYS Performed at [facility], [address], [place], [place] [address]    [facility] STAPHYLOCOCCUS AUREUS 05/31/2012     Lab Results    Component Value Date   ALBUMIN 2.0 (L) 07/20/2018   ALBUMIN 2.1 (L) 07/19/2018   ALBUMIN 2.8 (L) 07/17/2018    There is no height or weight on file to calculate BMI.  Orders:  No orders of the defined types were placed in this encounter.  No orders of the defined types were placed in this encounter.    Procedures: No procedures performed  Clinical Data: No additional findings.  ROS:  All other systems negative, except as noted in the HPI. Review of Systems  Objective: Vital Signs: There were no vitals taken for this visit.  Specialty Comments:  No specialty comments available.  PMFS History: Patient Active Problem List   Diagnosis Date Noted  . Cellulitis of right lower extremity   . Hx of BKA, right (HCC)   . Iron deficiency anemia 07/19/2018  . Acute osteomyelitis of right foot (HCC) 07/18/2018  . Type 2 diabetes mellitus (HCC) 07/18/2018  . Anemia 07/18/2018  . Abnormal LFTs 07/18/2018  . Hyponatremia 07/18/2018  . Hypoalbuminemia 07/18/2018  . Proteinuria 07/18/2018   Past Medical History:  Diagnosis Date  . Type 2 diabetes mellitus (HCC)     Family History  Problem Relation Age of Onset  . Hypertension Mother     Past Surgical History:  Procedure Laterality Date  . AMPUTATION  Right 07/19/2018   Procedure: AMPUTATION BELOW KNEE;  Surgeon: Nadara Mustard, MD;  Location: [facility] OR;  Service: Orthopedics;  Laterality: Right;  . AMPUTATION TOE Right 2018   Right big toe amputation.   Social History   Occupational History  . Not on file  Tobacco Use  . Smoking status: Never Smoker  . Smokeless tobacco: Never Used  Substance and Sexual Activity  . Alcohol use: No  . Drug use: No  . Sexual activity: Not on file

## 2018-09-05 ENCOUNTER — Ambulatory Visit (INDEPENDENT_AMBULATORY_CARE_PROVIDER_SITE_OTHER): Payer: Self-pay | Admitting: Physician Assistant

## 2018-09-05 ENCOUNTER — Encounter (INDEPENDENT_AMBULATORY_CARE_PROVIDER_SITE_OTHER): Payer: Self-pay | Admitting: Orthopedic Surgery

## 2018-09-05 DIAGNOSIS — E1142 Type 2 diabetes mellitus with diabetic polyneuropathy: Secondary | ICD-10-CM

## 2018-09-05 DIAGNOSIS — E43 Unspecified severe protein-calorie malnutrition: Secondary | ICD-10-CM

## 2018-09-05 DIAGNOSIS — Z89611 Acquired absence of right leg above knee: Secondary | ICD-10-CM

## 2018-09-06 ENCOUNTER — Encounter (INDEPENDENT_AMBULATORY_CARE_PROVIDER_SITE_OTHER): Payer: Self-pay | Admitting: Physician Assistant

## 2018-09-06 NOTE — Progress Notes (Signed)
Office Visit Note   Patient: Kurt Anderson           Date of Birth: [birth year]           MRN: [id number] Visit Date: 09/05/2018              Requested by: Ardyth Man, MD [address] [place], [place] [address] PCP: Ardyth Man, MD  Chief Complaint  Patient presents with  . Right Leg - Wound Check      HPI: Patient is a 47 year old male who is seen for postoperative follow-up following a right transtibial amputation on 07/19/2018.  He did have a fall following his initial surgery and did have some mild dehiscence of the incisional area but this is much improved.  He has been working with [employer] to obtain a prosthesis.  He reports his pain is much improved.  He has been walking with a walker.  Assessment & Plan: Visit Diagnoses:  1. 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 Type 2 diabetes mellitus with diabetic polyneuropathy, without long-term current use of insulin (HCC)   3. Severe protein-calorie malnutrition (HCC)     Plan: Continue stump shrinker stocking for edema control.  Continue working with [employer] for prosthesis.  He will follow-up here in 4 weeks or sooner should he have difficulties in the interim.  Follow-Up Instructions: Return in about 4 weeks (around 10/03/2018).   Ortho Exam  Patient is alert, oriented, no adenopathy, well-dressed, normal affect, normal respiratory effort. The right transtibial amputation incision is now healing well.  He does have some scabbing and crusting about the incisional area but this is much improved.  There is no signs of cellulitis.  The edema is much improved.  Imaging: No results found. No images are attached to the encounter.  Labs: Lab Results  Component Value Date   HGBA1C 7.4 (H) 07/17/2018   CRP 28.0 (H) 07/17/2018   REPTSTATUS 07/22/2018 FINAL 07/17/2018   CULT  07/17/2018    NO GROWTH 5 DAYS Performed at [facility], [address] [place], [place] [address]    [facility] STAPHYLOCOCCUS  AUREUS 05/31/2012     Lab Results  Component Value Date   ALBUMIN 2.0 (L) 07/20/2018   ALBUMIN 2.1 (L) 07/19/2018   ALBUMIN 2.8 (L) 07/17/2018    There is no height or weight on file to calculate BMI.  Orders:  No orders of the defined types were placed in this encounter.  No orders of the defined types were placed in this encounter.    Procedures: No procedures performed  Clinical Data: No additional findings.  ROS:  All other systems negative, except as noted in the HPI. Review of Systems  Objective: Vital Signs: There were no vitals taken for this visit.  Specialty Comments:  No specialty comments available.  PMFS History: Patient Active Problem List   Diagnosis Date Noted  . Cellulitis of right lower extremity   . Hx of BKA, right (HCC)   . Iron deficiency anemia 07/19/2018  . Acute osteomyelitis of right foot (HCC) 07/18/2018  . Type 2 diabetes mellitus (HCC) 07/18/2018  . Anemia 07/18/2018  . Abnormal LFTs 07/18/2018  . Hyponatremia 07/18/2018  . Hypoalbuminemia 07/18/2018  . Proteinuria 07/18/2018   Past Medical History:  Diagnosis Date  . Type 2 diabetes mellitus (HCC)     Family History  Problem Relation Age of Onset  . Hypertension Mother     Past Surgical History:  Procedure Laterality Date  .  AMPUTATION Right 07/19/2018   Procedure: AMPUTATION BELOW KNEE;  Surgeon: Nadara Mustard, MD;  Location: [facility] OR;  Service: Orthopedics;  Laterality: Right;  . AMPUTATION TOE Right 2018   Right big toe amputation.   Social History   Occupational History  . Not on file  Tobacco Use  . Smoking status: Never Smoker  . Smokeless tobacco: Never Used  Substance and Sexual Activity  . Alcohol use: No  . Drug use: No  . Sexual activity: Not on file

## 2018-10-03 ENCOUNTER — Ambulatory Visit (INDEPENDENT_AMBULATORY_CARE_PROVIDER_SITE_OTHER): Payer: Self-pay | Admitting: Orthopedic Surgery

## 2018-10-03 ENCOUNTER — Encounter (INDEPENDENT_AMBULATORY_CARE_PROVIDER_SITE_OTHER): Payer: Self-pay | Admitting: Orthopedic Surgery

## 2018-10-03 DIAGNOSIS — Z89511 Acquired absence of right leg below knee: Secondary | ICD-10-CM | POA: Insufficient documentation

## 2018-10-03 NOTE — Progress Notes (Signed)
Office Visit Note   Patient: Kurt Anderson           Date of Birth: July 09, 1971           MRN: [id number] Visit Date: 10/03/2018              Requested by: Ardyth Man, MD [address] [place], [place] [address] PCP: Ardyth Man, MD  No chief complaint on file.     HPI: Patient is a 47 year old gentleman approximately 10 weeks status post right transtibial amputation.  Patient is seen with family and an interpreter.  Patient states he has little pain.  He has just been seen by [employer] and was given a new stump shrinker.  Patient is not taking antibiotics.  Assessment & Plan: Visit Diagnoses:  1. Acquired absence of right leg below knee [facility])     Plan: Patient was given instructions to work on scar massage.  He will follow-up with [employer] in 3 weeks to be fit for his prosthetic limb follow-up in the office in 4 weeks.  Follow-Up Instructions: Return in about 4 weeks (around 10/31/2018).   Ortho Exam  Patient is alert, oriented, no adenopathy, well-dressed, normal affect, normal respiratory effort. Examination patient has good hair growth the residual limb is consolidating nicely there is no redness no cellulitis no signs of infection he has a few scabs that are about 5 mm in diameter.  Imaging: No results found. No images are attached to the encounter.  Labs: Lab Results  Component Value Date   HGBA1C 7.4 (H) 07/17/2018   CRP 28.0 (H) 07/17/2018   REPTSTATUS 07/22/2018 FINAL 07/17/2018   CULT  07/17/2018    NO GROWTH 5 DAYS Performed at [facility], [address], [place], [place] [address]    [facility] STAPHYLOCOCCUS AUREUS 05/31/2012     Lab Results  Component Value Date   ALBUMIN 2.0 (L) 07/20/2018   ALBUMIN 2.1 (L) 07/19/2018   ALBUMIN 2.8 (L) 07/17/2018    There is no height or weight on file to calculate BMI.  Orders:  No orders of the defined types were placed in this encounter.  No orders of the defined types were placed in this  encounter.    Procedures: No procedures performed  Clinical Data: No additional findings.  ROS:  All other systems negative, except as noted in the HPI. Review of Systems  Objective: Vital Signs: There were no vitals taken for this visit.  Specialty Comments:  No specialty comments available.  PMFS History: Patient Active Problem List   Diagnosis Date Noted  . Acquired absence of right leg below knee (HCC) 10/03/2018  . Cellulitis of right lower extremity   . Hx of BKA, right (HCC)   . Iron deficiency anemia 07/19/2018  . Acute osteomyelitis of right foot (HCC) 07/18/2018  . Type 2 diabetes mellitus (HCC) 07/18/2018  . Anemia 07/18/2018  . Abnormal LFTs 07/18/2018  . Hyponatremia 07/18/2018  . Hypoalbuminemia 07/18/2018  . Proteinuria 07/18/2018   Past Medical History:  Diagnosis Date  . Type 2 diabetes mellitus (HCC)     Family History  Problem Relation Age of Onset  . Hypertension Mother     Past Surgical History:  Procedure Laterality Date  . AMPUTATION Right 07/19/2018   Procedure: AMPUTATION BELOW KNEE;  Surgeon: Nadara Mustard, MD;  Location: [facility] OR;  Service: Orthopedics;  Laterality: Right;  . AMPUTATION TOE Right 2018   Right big toe amputation.   Social History   Occupational  History  . Not on file  Tobacco Use  . Smoking status: Never Smoker  . Smokeless tobacco: Never Used  Substance and Sexual Activity  . Alcohol use: No  . Drug use: No  . Sexual activity: Not on file

## 2018-10-31 ENCOUNTER — Ambulatory Visit (INDEPENDENT_AMBULATORY_CARE_PROVIDER_SITE_OTHER): Payer: Self-pay | Admitting: Physician Assistant

## 2018-10-31 ENCOUNTER — Encounter (INDEPENDENT_AMBULATORY_CARE_PROVIDER_SITE_OTHER): Payer: Self-pay | Admitting: Physician Assistant

## 2018-10-31 ENCOUNTER — Ambulatory Visit (INDEPENDENT_AMBULATORY_CARE_PROVIDER_SITE_OTHER): Payer: Self-pay | Admitting: Orthopaedic Surgery

## 2018-10-31 VITALS — Ht 67.0 in | Wt 218.0 lb

## 2018-10-31 DIAGNOSIS — E43 Unspecified severe protein-calorie malnutrition: Secondary | ICD-10-CM

## 2018-10-31 DIAGNOSIS — E1142 Type 2 diabetes mellitus with diabetic polyneuropathy: Secondary | ICD-10-CM

## 2018-10-31 DIAGNOSIS — Z89511 Acquired absence of right leg below knee: Secondary | ICD-10-CM

## 2018-10-31 NOTE — Progress Notes (Signed)
Office Visit Note   Patient: Kurt Anderson           Date of Birth: 04/20/1971           MRN: [id number] Visit Date: 10/31/2018              Requested by: Ardyth Man, MD [address] [place], [place] [address] PCP: Ardyth Man, MD  Chief Complaint  Patient presents with  . Right Leg - Follow-up    Right BKA      HPI: The patient is a 47 yo male who comes in with family who are able to translate for this examiner for post operative follow up following a right transtibial amputation 07/19/18. He is working with [employer] and has a follow up appointment with them today. He reports his is doing well. His pain is improving. He is walking with a rolling walker.   Assessment & Plan: Visit Diagnoses:  1. Acquired absence of right leg below knee (HCC)   2. Severe protein-calorie malnutrition (HCC)   3. Type 2 diabetes mellitus with diabetic polyneuropathy, without long-term current use of insulin (HCC)     Plan: Continue to work with [employer] for prosthesis fabrication. We discussed that he will work with PT with the prosthesis and [employer] will adjust the prosthesis as needed. We discussed that he will need to follow up here at least yearly for re-evaluation of his prosthetic.  We will see him on an prn basis and/or annually.   Follow-Up Instructions: Return if symptoms worsen or fail to improve.   Ortho Exam  Patient is alert, oriented, no adenopathy, well-dressed, normal affect, normal respiratory effort. The right transtibial amputation is well healed and without signs of infection or cellulitis. The patient did slip with his walker when he was getting down off the exam table but caught himself with his arms and his left leg. There was no sign of injury. He denied pain over the left leg and denied pain in the residual right lower limb or in his upper extremities. He was ambulating without difficulty weight bearing as tolerated on the left leg and arms with his walker at the end  of the visit.   Imaging: No results found. No images are attached to the encounter.  Labs: Lab Results  Component Value Date   HGBA1C 7.4 (H) 07/17/2018   CRP 28.0 (H) 07/17/2018   REPTSTATUS 07/22/2018 FINAL 07/17/2018   CULT  07/17/2018    NO GROWTH 5 DAYS Performed at [facility], [address] [place], [place] [address]    [facility] STAPHYLOCOCCUS AUREUS 05/31/2012     Lab Results  Component Value Date   ALBUMIN 2.0 (L) 07/20/2018   ALBUMIN 2.1 (L) 07/19/2018   ALBUMIN 2.8 (L) 07/17/2018    Body mass index is 34.14 kg/m.  Orders:  No orders of the defined types were placed in this encounter.  No orders of the defined types were placed in this encounter.    Procedures: No procedures performed  Clinical Data: No additional findings.  ROS:  All other systems negative, except as noted in the HPI. Review of Systems  Objective: Vital Signs: Ht 5\' 7"  (1.702 m)   Wt 218 lb (98.9 kg)   BMI 34.14 kg/m   Specialty Comments:  No specialty comments available.  PMFS History: Patient Active Problem List   Diagnosis Date Noted  . Acquired absence of right leg below knee (HCC) 10/03/2018  . Cellulitis of right lower extremity   .  Hx of BKA, right (HCC)   . Iron deficiency anemia 07/19/2018  . Acute osteomyelitis of right foot (HCC) 07/18/2018  . Type 2 diabetes mellitus (HCC) 07/18/2018  . Anemia 07/18/2018  . Abnormal LFTs 07/18/2018  . Hyponatremia 07/18/2018  . Hypoalbuminemia 07/18/2018  . Proteinuria 07/18/2018   Past Medical History:  Diagnosis Date  . Type 2 diabetes mellitus (HCC)     Family History  Problem Relation Age of Onset  . Hypertension Mother     Past Surgical History:  Procedure Laterality Date  . AMPUTATION Right 07/19/2018   Procedure: AMPUTATION BELOW KNEE;  Surgeon: Nadara Mustarduda, [name] V, MD;  [facility];  Service: Orthopedics;  Laterality: Right;  . AMPUTATION TOE Right 2018   Right big toe amputation.   Social History    Occupational History  . Not on file  Tobacco Use  . Smoking status: Never Smoker  . Smokeless tobacco: Never Used  Substance and Sexual Activity  . Alcohol use: No  . Drug use: No  . Sexual activity: Not on file

## 2018-11-01 ENCOUNTER — Encounter (INDEPENDENT_AMBULATORY_CARE_PROVIDER_SITE_OTHER): Payer: Self-pay | Admitting: Physician Assistant

## 2019-10-10 IMAGING — CR DG FOOT COMPLETE 3+V*R*
1 series · 3 of 3 positions shown · non-contrast
Comparison: None.

CLINICAL DATA: Infected blister along the plantar aspect of the
right foot. History of diabetes. Code sepsis.

EXAM:
RIGHT FOOT COMPLETE - 3+ VIEW

[Series 1: ap · 0.17mm/px · 3 of 3 slices shown]
[im 1/3]
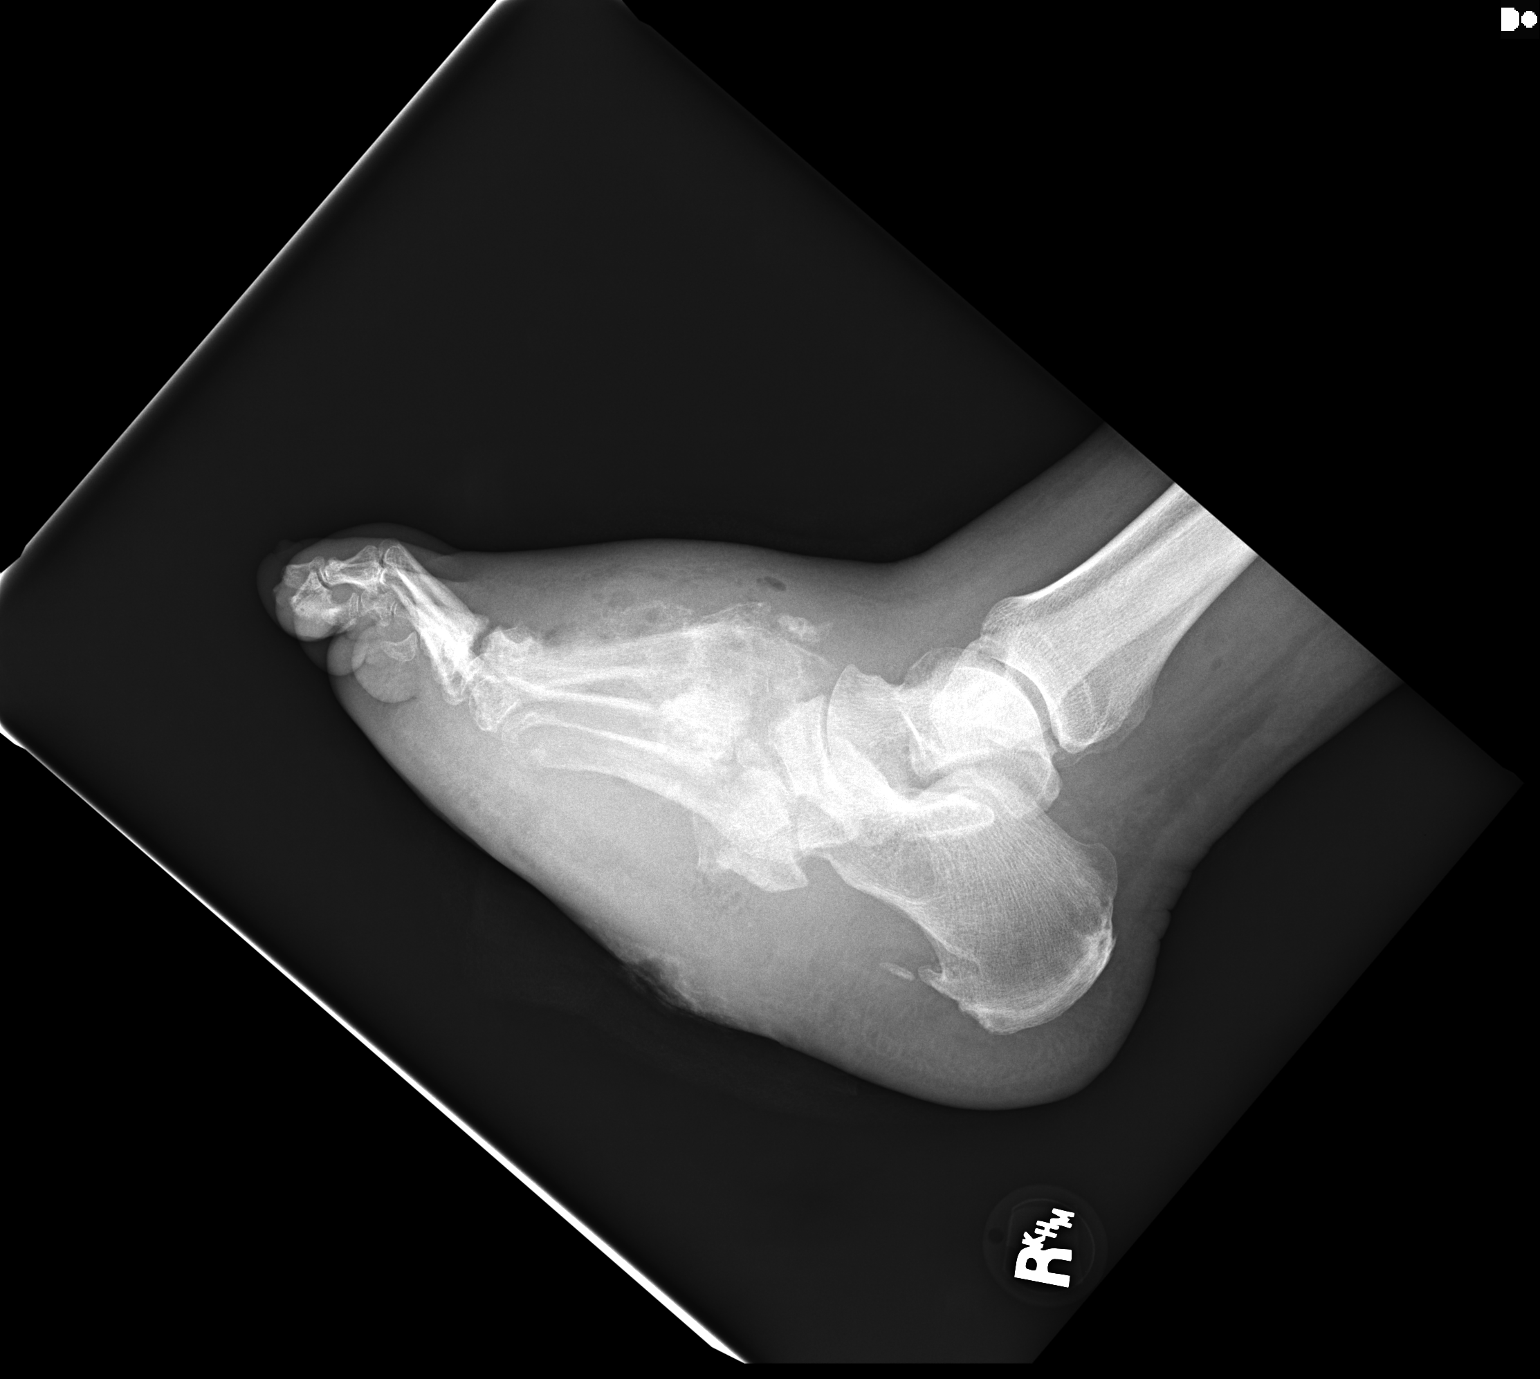
[im 2/3]
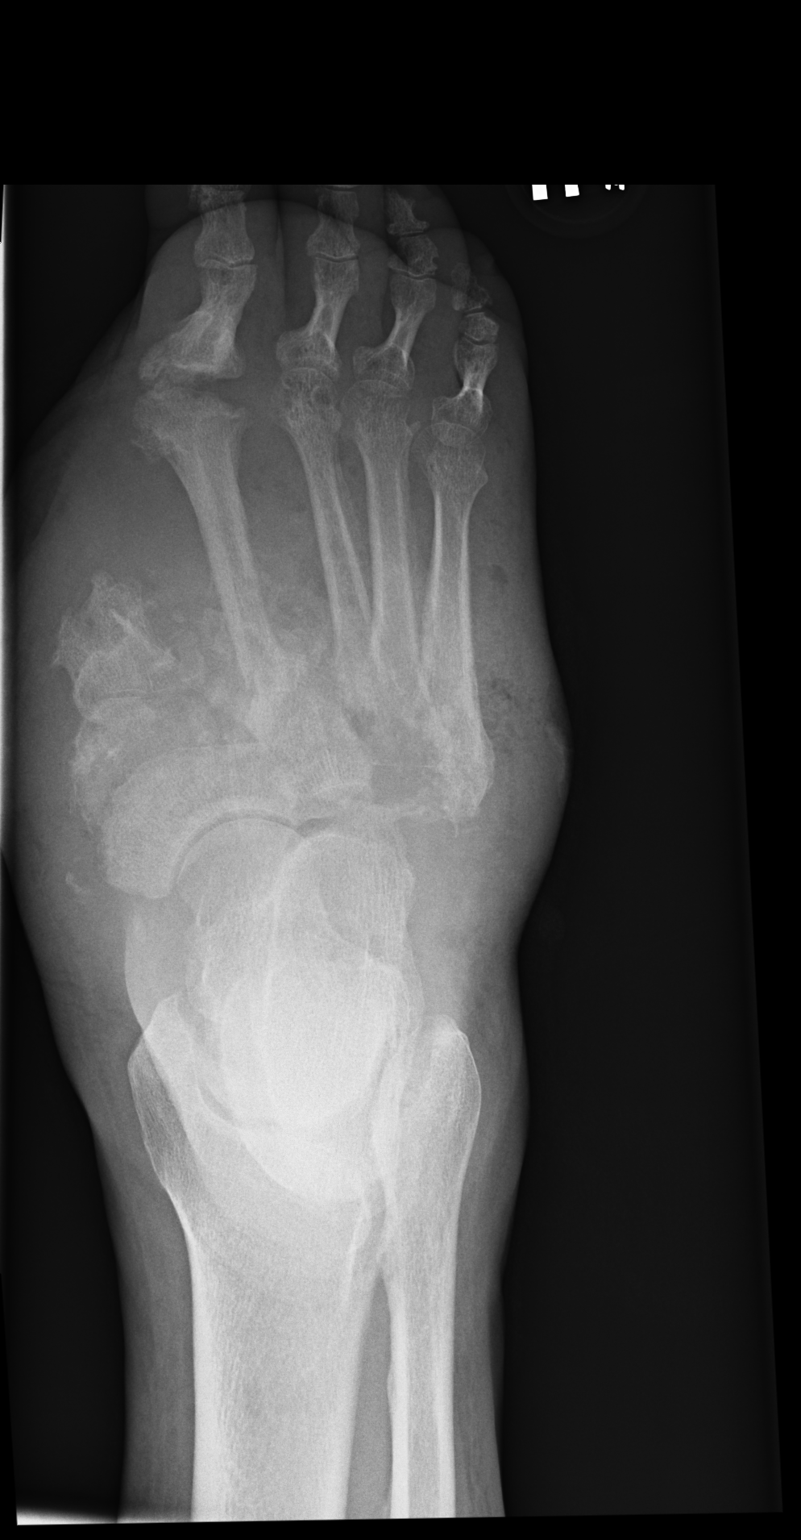
[im 3/3]
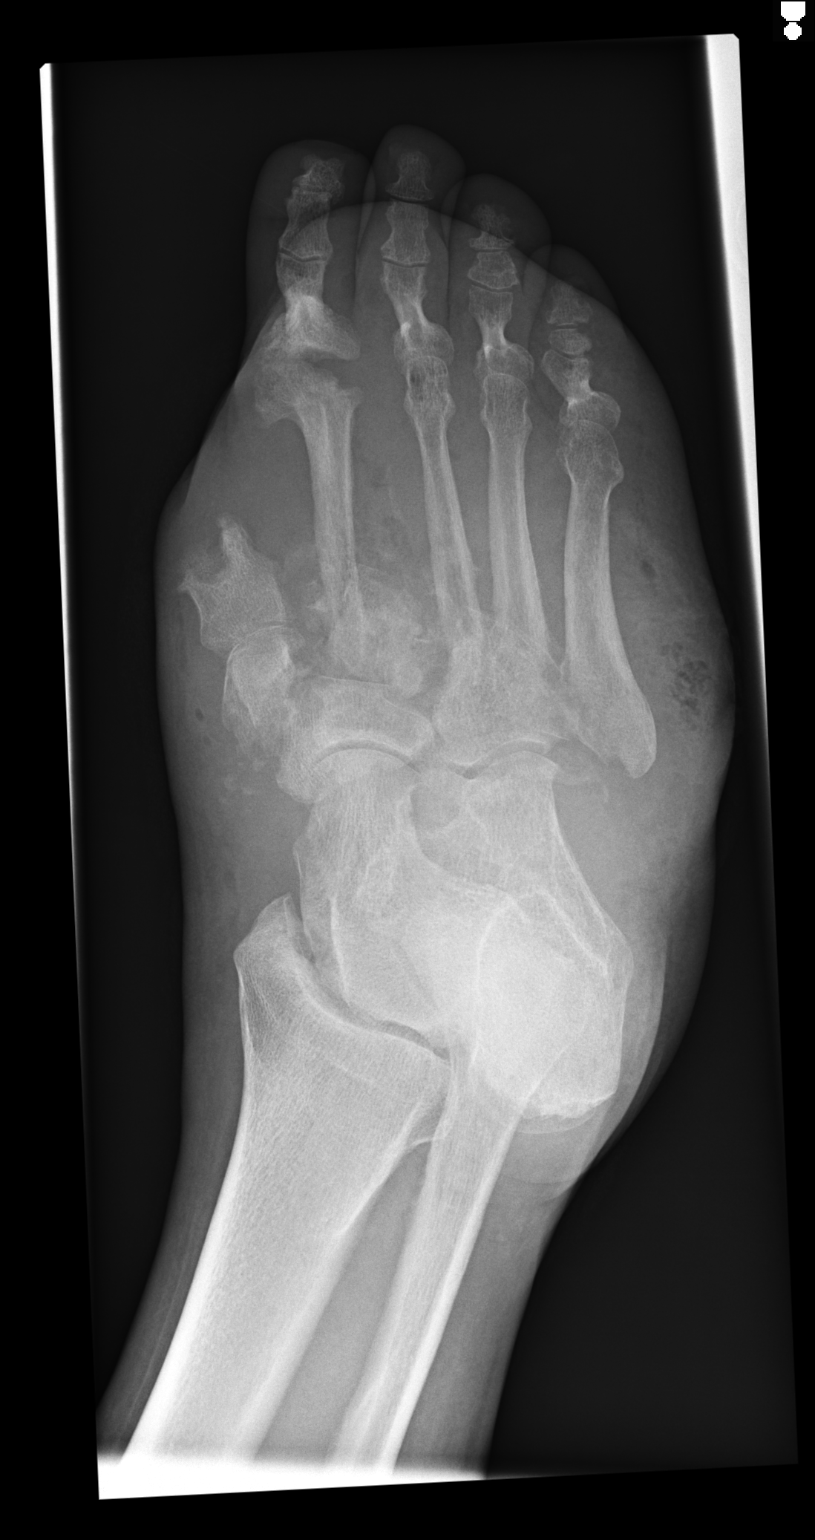

[3 of 3 positions shown; findings below may reference images not displayed]

FINDINGS: Soft tissues:

There is marked soft tissue swelling of the included ankle and more
so of the midfoot where there are scattered areas of subcutaneous
soft tissue emphysema noted along the dorsum, medial and lateral
aspect of the mid and forefoot. Soft tissue ulceration is seen along
the plantar surface of the midfoot measuring approximately 2 cm in
length and 6 mm deep.

Forefoot:

There has been amputation of the first ray at the base of the first
metatarsal. The second through fourth distal and middle phalanges
are intact. Osteoarthritic and erosive change of the second
metatarsal head at the second MTP joint is identified with bony
spurring at the base of the second proximal phalanx. The third
through fifth MTP joints are intact.

Midfoot:

Bony destruction and disorganization of the midfoot is noted more
markedly affecting the middle and lateral cuneiform with subchondral
erosive change at the base of the medial cuneiform, tarsal navicular
and cuboid. Laterally subluxed appearance of the third through fifth
rays with widening between the second and third as well as first and
second metatarsals is noted. Findings likely represent stigmata of
Charcot neuropathy the presence of soft tissue swelling and
emphysema raise concern for superimposed osteomyelitis of the
midfoot more so involving the base of the second through fifth
metatarsals with bone destruction seen as well as of the middle
cuneiform.

Hindfoot:

Prominent plantar calcaneal enthesophyte. Slight cortical
irregularity along the medial talar dome may be secondary to
osteoarthritis though the possibility of osteochondritis dissecans
or other transchondral injury of the talar dome is not entirely
excluded.
IMPRESSION: 1. Marked soft tissue swelling of the included ankle and more so of
the midfoot with associated plantar soft tissue ulceration measuring
2 cm in length by 0.6 cm in depth at the level of the midfoot.
2. Bone destruction and disorganization of the midfoot articulation
with lateral subluxation of the third fifth rays relative to the
midfoot likely represent stigmata of Charcot neuropathy. However,
superimposed on neuropathy is what appear be areas of irregular bone
destruction specially involving the base of the second through fifth
metatarsals raising concern for osteomyelitis.
3. Soft tissue emphysema is noted of the foot and the possibility of
necrotizing fasciitis is also raised.
4. Talar dome irregularity along its medial aspect is suggested on
the oblique view. Osteochondral defect might have this appearance or
change secondary to advanced osteoarthritis across the tibiotalar
joint.

## 2019-10-10 IMAGING — CT CT EXTREM LOW W/ CM*R*
2 of 3 series · 11 of 46 positions shown, 12 images · IV contrast (Isovue)
Comparison: Right foot radiographs performed earlier today at [DATE]
p.m.

CONTRAST:  100mL [id number] IOPAMIDOL ([id number]) INJECTION 61%

CLINICAL DATA: Acute onset of right plantar foot wound, with
swelling and drainage. Abnormal radiographs. Further evaluation
requested.

EXAM:
CT OF THE LOWER RIGHT EXTREMITY WITH CONTRAST
TECHNIQUE: Multidetector CT imaging of the lower right extremity was performed
according to the standard protocol following intravenous contrast
administration.

[Series 5: coronal st · coronal · 0.42mm/px · 3 of 116 slices shown]
[im 39/116  soft-tissue]
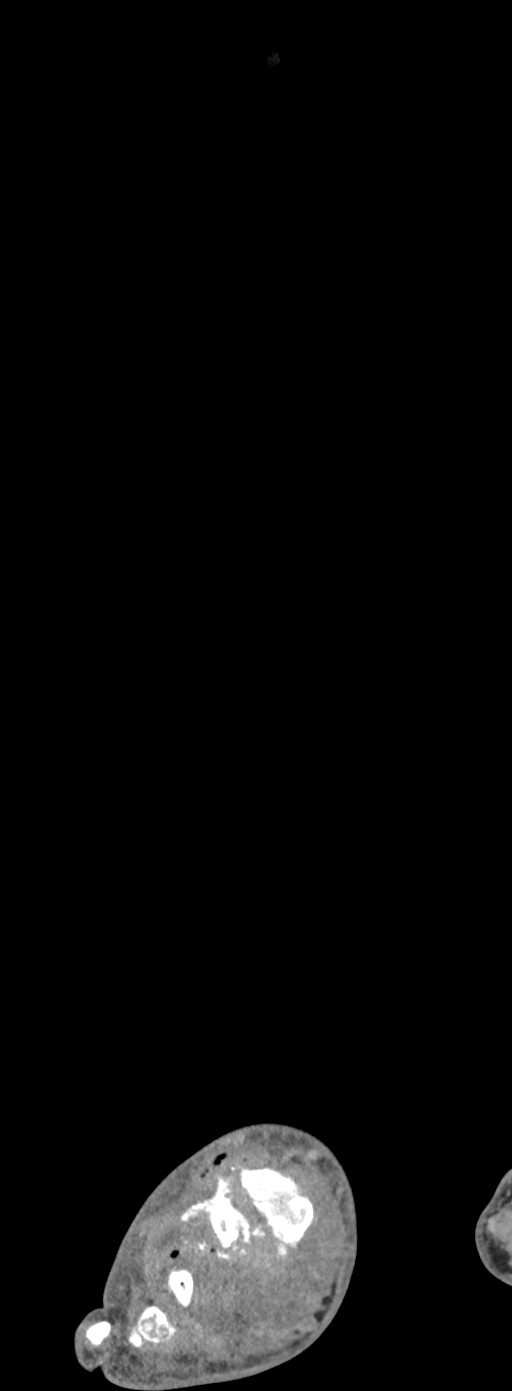
[im 52/116  soft-tissue]
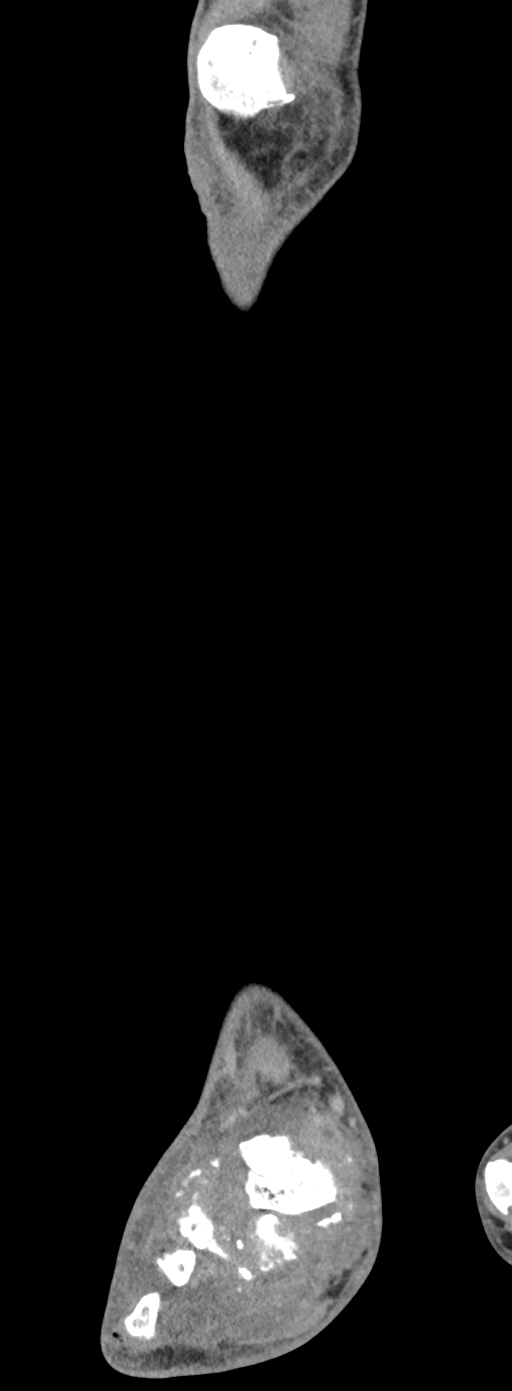
[im 64/116  soft-tissue]
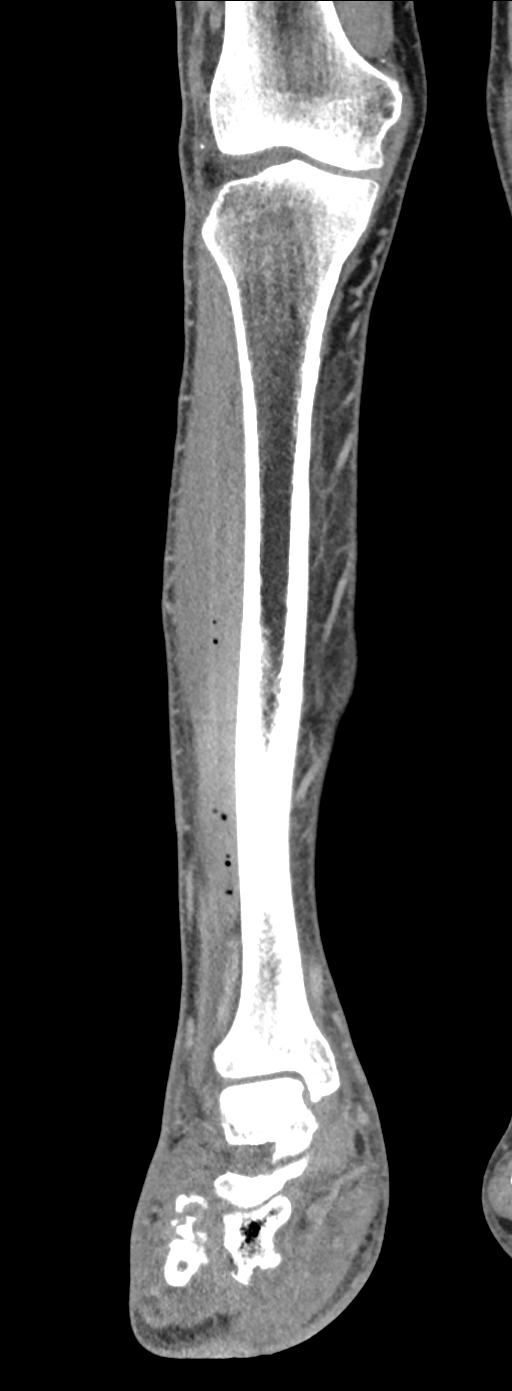

[Series 9: 3 axial st · axial · 0.45mm/px · z∈[-1356,-826]mm · 8 of 303 slices shown, 9 images]
[im 20/303  soft-tissue]
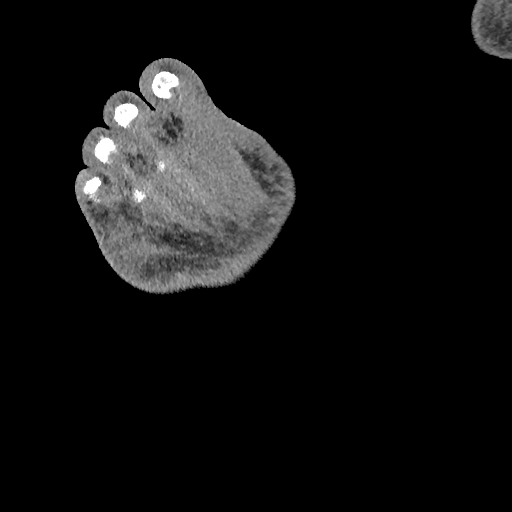
[im 20/303  bone]
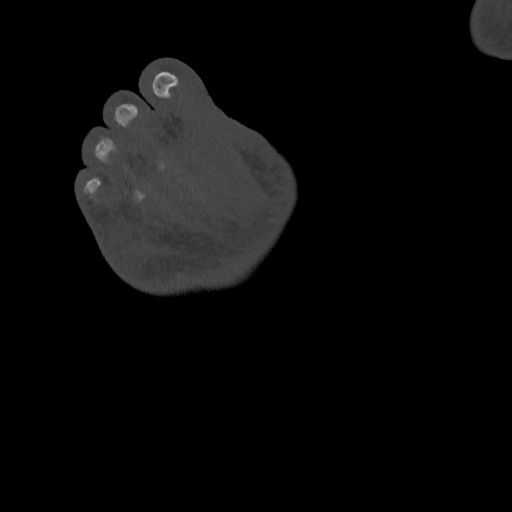
[im 59/303  soft-tissue]
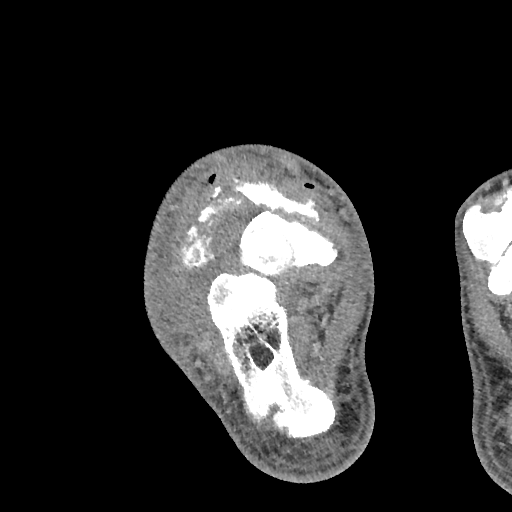
[im 98/303  soft-tissue]
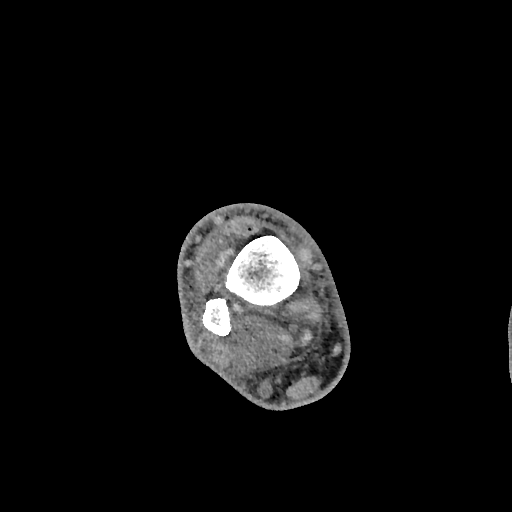
[im 137/303  soft-tissue]
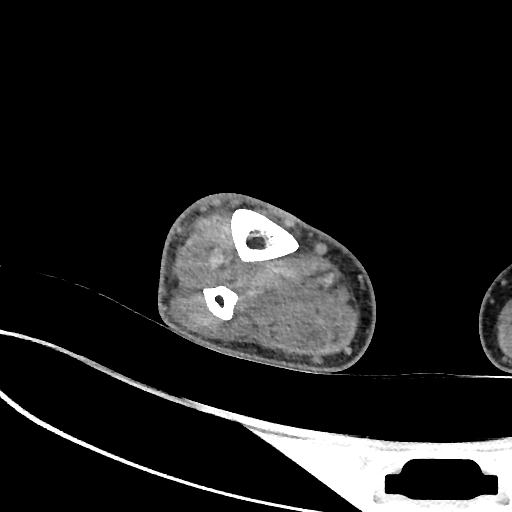
[im 166/303  soft-tissue]
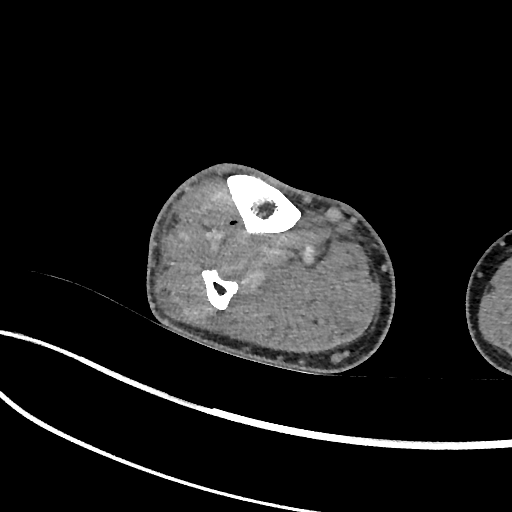
[im 205/303  soft-tissue]
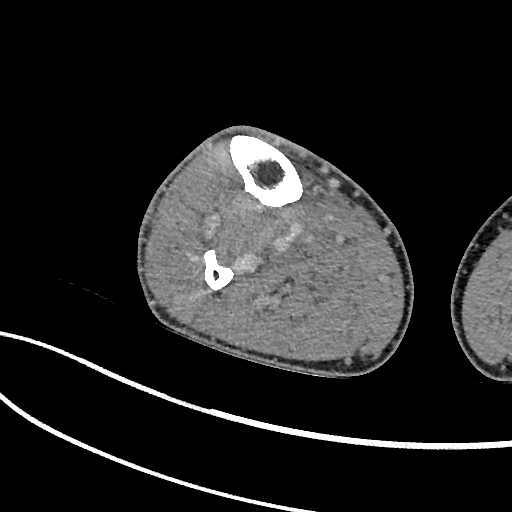
[im 244/303  soft-tissue]
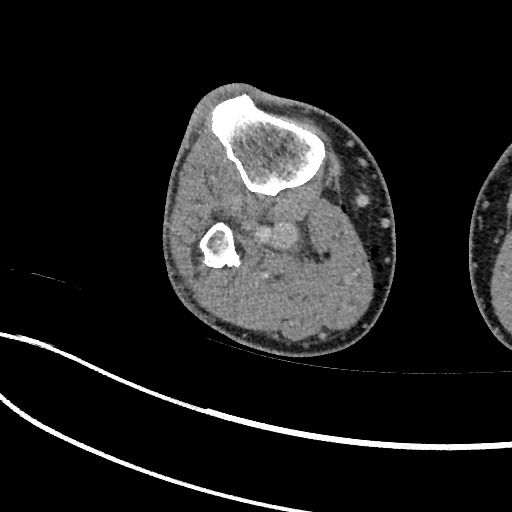
[im 283/303  soft-tissue]
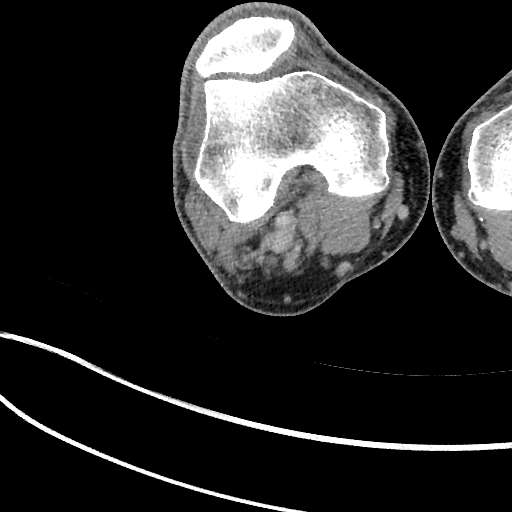

[11 of 46 positions shown; findings below may reference images not displayed]

FINDINGS: Bones/Joint/Cartilage

There is diffuse bony destruction involving the midfoot, with callus
formation about the bases of the metatarsals and diffuse osseous
erosions, compatible with osteomyelitis. Air is noted tracking into
much of the cuboid. Underlying changes of Charcot joint are again
suggested.

Associated soft tissue air tracks about the midfoot and forefoot,
and into the distal second metatarsal. Soft tissue air tracks
proximally about the distal tibia and lateral to the proximal
fibula, with mild soft tissue air noted overlying the medial
gastrocnemius musculature just below the level of the knee. Trace
associated fluid is noted. This is suspicious for diffuse infection
with a gas producing organism; necrotizing fasciitis is a concern.

No well-defined joint effusion is identified, though extensive
phlegmon and fluid are noted about the midfoot.

Ligaments

Suboptimally assessed by CT.

Muscles and Tendons

Not well assessed at the midfoot due to the extent of soft tissue
inflammation. The visualized musculature and tendons are grossly
unremarkable.

Soft tissues

A soft tissue laceration is noted along the plantar aspect of the
foot. Extensive soft tissue swelling is noted about the midfoot and
forefoot, with scattered soft tissue air as described above.

Vague intramuscular edema tracks along the right lower leg,
corresponding to scattered soft tissue air and fluid as described
above.
IMPRESSION: 1. Diffuse bony destruction involving the midfoot, with callus
formation about the bases of the metatarsals and diffuse osseous
erosions, compatible with osteomyelitis superimposed on changes of
Charcot joint. Air tracks about the midfoot and forefoot, and into
the distal second metatarsal.
2. Soft tissue air tracks proximally about the distal tibia and
lateral to the proximal fibula, with mild soft tissue air overlying
the medial gastrocnemius musculature just below the level of the
knee. Trace associated fluid noted. This is suspicious for diffuse
infection with a gas producing organism. Necrotizing fasciitis is a
concern.
3. Vague intramuscular edema tracks along the right lower leg,
corresponding to scattered soft tissue air and fluid described
above.
4. Soft tissue laceration along the plantar aspect of the foot.

These results were called by telephone at the time of interpretation
on 07/18/2018 at [DATE] to Dr. NETSAI UYANDA, who verbally
acknowledged these results.
